# Patient Record
Sex: Female | Born: 1995 | Race: White | Hispanic: No | Marital: Single | Smoking: Former smoker
Health system: Southern US, Community
[De-identification: ages and names within clinical notes are randomized; demographics above are authoritative.]

## PROBLEM LIST (undated history)

## (undated) DIAGNOSIS — F32A Depression, unspecified: Secondary | ICD-10-CM

## (undated) DIAGNOSIS — Z8249 Family history of ischemic heart disease and other diseases of the circulatory system: Secondary | ICD-10-CM

## (undated) DIAGNOSIS — N83209 Unspecified ovarian cyst, unspecified side: Secondary | ICD-10-CM

## (undated) DIAGNOSIS — N809 Endometriosis, unspecified: Secondary | ICD-10-CM

## (undated) DIAGNOSIS — F419 Anxiety disorder, unspecified: Secondary | ICD-10-CM

## (undated) DIAGNOSIS — F329 Major depressive disorder, single episode, unspecified: Secondary | ICD-10-CM

## (undated) DIAGNOSIS — M545 Low back pain, unspecified: Secondary | ICD-10-CM

## (undated) HISTORY — PX: OTHER SURGICAL HISTORY: SHX169

## (undated) HISTORY — DX: Endometriosis, unspecified: N80.9

## (undated) HISTORY — DX: Family history of ischemic heart disease and other diseases of the circulatory system: Z82.49

---
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
## 2015-04-18 ENCOUNTER — Encounter (HOSPITAL_COMMUNITY): Payer: Self-pay | Admitting: Emergency Medicine

## 2015-04-18 ENCOUNTER — Emergency Department (HOSPITAL_COMMUNITY)
Admission: EM | Admit: 2015-04-18 | Discharge: 2015-04-18 | Discharge status: Absent without leave | Payer: Self-pay | Source: Home / Self Care

## 2015-04-18 ENCOUNTER — Emergency Department (HOSPITAL_COMMUNITY): Payer: Medicaid Other

## 2015-04-18 ENCOUNTER — Emergency Department (HOSPITAL_COMMUNITY)
Admission: EM | Admit: 2015-04-18 | Discharge: 2015-04-18 | Disposition: A | Discharge status: Home / Self Care | Payer: Medicaid Other | Attending: Emergency Medicine | Admitting: Emergency Medicine

## 2015-04-18 DIAGNOSIS — M79662 Pain in left lower leg: Secondary | ICD-10-CM | POA: Insufficient documentation

## 2015-04-18 DIAGNOSIS — J45901 Unspecified asthma with (acute) exacerbation: Secondary | ICD-10-CM | POA: Insufficient documentation

## 2015-04-18 DIAGNOSIS — Z8659 Personal history of other mental and behavioral disorders: Secondary | ICD-10-CM | POA: Insufficient documentation

## 2015-04-18 DIAGNOSIS — J029 Acute pharyngitis, unspecified: Secondary | ICD-10-CM | POA: Insufficient documentation

## 2015-04-18 DIAGNOSIS — Z720 Tobacco use: Secondary | ICD-10-CM | POA: Insufficient documentation

## 2015-04-18 DIAGNOSIS — Z79899 Other long term (current) drug therapy: Secondary | ICD-10-CM | POA: Insufficient documentation

## 2015-04-18 DIAGNOSIS — M79605 Pain in left leg: Secondary | ICD-10-CM

## 2015-04-18 DIAGNOSIS — Z8742 Personal history of other diseases of the female genital tract: Secondary | ICD-10-CM | POA: Insufficient documentation

## 2015-04-18 LAB — D-DIMER, QUANTITATIVE (NOT AT ARMC)

## 2015-04-18 MED ORDER — ALBUTEROL SULFATE HFA 108 (90 BASE) MCG/ACT IN AERS
2.0000 | INHALATION_SPRAY | Freq: Once | RESPIRATORY_TRACT | Status: AC
  Administered 2015-04-18: 2 via RESPIRATORY_TRACT
  Filled 2015-04-18: qty 6.7

## 2015-04-18 NOTE — ED Notes (Signed)
Per pt, states cold symptoms, sore throat and congestion for over a week

## 2015-04-18 NOTE — Discharge Instructions (Signed)
There is not appear to be an emergent cause for your symptoms at this time. There is no evidence of blood clot or skin infection. Please take over-the-counter medications for your respiratory complaints. Use your inhaler as needed. Follow-up with your doctors.

## 2015-04-18 NOTE — ED Provider Notes (Signed)
CSN: [id number]     Arrival date & time 04/18/15  1355 History  This chart was scribed for non-physician practitioner Joycie Peek, PA-C working with Mirian Mo, MD by Murriel Hopper, ED Scribe. This patient was seen in room WTR6/WTR6 and the patient's care was started at 3:30 PM.    Chief Complaint  Patient presents with  . URI      The history is provided by the patient. No language interpreter was used.     HPI Comments: Christina Contreras is a 19 y.o. female with Asthma who presents to the Emergency Department complaining of constant worsening posterior left lower leg pain that has been present for over a week. Pt reports that her lower leg feels tight, and states when she walks she feels a stretch like it is going to pop. Her father states that she has almost collapsed while walking on several occasions. Her father also states that she is in need of an inhaler as pt reports she has had SOB, cough, sore throat for the past 3 days. Pt denies any recent travel, surgeries, hemoptysis, chest pain and states that she takes birth control.     Past Medical History  Diagnosis Date  . Asthma   . Other and unspecified ovarian cysts   . Anxiety     no med  . Depression     no med  . Lower back pain     ? DDD  . Endometriosis determined by laparoscopy    Past Surgical History  Procedure Laterality Date  . Tubes in ears    . Laparoscopy N/A 08/16/2014    Procedure: LAPAROSCOPY DIAGNOSTIC;  Surgeon: Allie Bossier, MD;  Location: [facility];  Service: Gynecology;  Laterality: N/A;   Family History  Problem Relation Age of Onset  . Endometriosis Mother   . Endometriosis Other    Social History  Substance Use Topics  . Smoking status: Current Every Day Smoker -- 0.25 packs/day for 3 years    Types: Cigarettes  . Smokeless tobacco: Never Used  . Alcohol Use: No   OB History    Gravida Para Term Preterm AB TAB SAB Ectopic Multiple Living   0 0 0 0 0 0 0 0 0 0      Review of Systems   Constitutional: Negative for fever and chills.  HENT: Positive for sore throat.   Respiratory: Positive for cough and shortness of breath.   Musculoskeletal: Positive for myalgias and arthralgias.      Allergies  Review of patient's allergies indicates no known allergies.  Home Medications   Prior to Admission medications   Medication Sig Start Date End Date Taking? Authorizing Provider  BIOTIN PO Take 1 tablet by mouth daily.   Yes Historical Provider, MD  ibuprofen (ADVIL,MOTRIN) 200 MG tablet Take 400 mg by mouth every 6 (six) hours as needed for moderate pain.    Yes Historical Provider, MD  IRON PO Take 1 tablet by mouth daily with breakfast.   Yes Historical Provider, MD  Norethindrone Acetate-Ethinyl Estrad-FE (LOESTRIN 24 FE) 1-20 MG-MCG(24) tablet Take 1 tablet by mouth daily. 12/28/14  Yes Willodean Rosenthal, MD  Levonorgestrel-Ethinyl Estradiol (AMETHIA,CAMRESE) 0.15-0.03 &0.01 MG tablet Take 1 tablet by mouth daily. Patient not taking: Reported on 12/28/2014 09/19/14   Allie Bossier, MD   BP 111/52 mmHg  Pulse 70  Temp(Src) 98.2 F (36.8 C) (Oral)  Resp 16  SpO2 99%  LMP 04/18/2015 Physical Exam  Constitutional: She is oriented to person,  place, and time. She appears well-developed and well-nourished.  Awake, alert, nontoxic appearance.  HENT:  Head: Normocephalic and atraumatic.  Eyes: Right eye exhibits no discharge. Left eye exhibits no discharge.  Neck: Neck supple.  Cardiovascular: Normal rate.   Pulmonary/Chest: Effort normal. She exhibits no tenderness.  Abdominal: Soft. She exhibits no distension. There is no tenderness. There is no rebound.  Musculoskeletal: She exhibits no tenderness.  Baseline ROM, no obvious new focal weakness. Diffuse tenderness throughout left calf and lower extremity. No obvious swelling or erythema. Dorsalis pedis pulses intact and equal bilaterally.  Neurological: She is alert and oriented to person, place, and time.  Mental  status and motor strength appears baseline for patient and situation.  Skin: Skin is warm and dry. No rash noted.  Psychiatric: She has a normal mood and affect.  Nursing note and vitals reviewed.   ED Course  Procedures (including critical care time)  DIAGNOSTIC STUDIES: Oxygen Saturation is 100% on room air, normal by my interpretation.    COORDINATION OF CARE: 3:36 PM Discussed treatment plan with pt at bedside and pt agreed to plan.   Labs Review Labs Reviewed  D-DIMER, QUANTITATIVE (NOT AT [facility])    Imaging Review Dg Tibia/fibula Left  04/18/2015   CLINICAL DATA:  19 year old female with persistent unexplained left lower extremity pain for 3 days. Initial encounter.  EXAM: LEFT TIBIA AND FIBULA - 2 VIEW  COMPARISON:  Left knee series 5613.  Left ankle series 815 2009.  FINDINGS: Bone mineralization is within normal limits. Left tibia and fibula intact. Alignment at the left knee and ankle appears normal. Calcaneus intact. Grossly normal soft tissue contours.  IMPRESSION: No acute osseous abnormality identified in the left tib-fib.   Electronically Signed   By: Odessa Fleming M.D.   On: 04/18/2015 16:13   I have personally reviewed and evaluated these images and lab results as part of my medical decision-making.   EKG Interpretation None     Meds given in ED:  Medications  albuterol (PROVENTIL HFA;VENTOLIN HFA) 108 (90 BASE) MCG/ACT inhaler 2 puff (2 puffs Inhalation Given 04/18/15 1746)    Discharge Medication List as of 04/18/2015  5:42 PM     Filed Vitals:   04/18/15 1408 04/18/15 1704  BP: 101/76 111/52  Pulse: 107 70  Temp: 98.2 F (36.8 C)   TempSrc: Oral   Resp: 16   SpO2: 100% 99%    MDM  Vitals stable  -afebrile Pt resting comfortably in ED. PE--normal lung exam, no tachycardia on my exam, heart rate mid 80s Labwork--Due to patient's tachycardia on arrival, complaints of leg pain and tightness along with OCP risk factors, we will obtain d-dimer. D-dimer is  negative. Imaging--Plain films of left tibia show no osseous abnormalities or gaseous pattern suggestive of any necrotizing process.  No evidence of other acute or emergent pathology at this time. Patient DC'd with albuterol inhaler and instructions for supportive care at home. I discussed all relevant lab findings and imaging results with pt and they verbalized understanding. Discussed f/u with PCP within 48 hrs and return precautions, pt very amenable to plan.  Final diagnoses:  Left leg pain    I personally performed the services described in this documentation, which was scribed in my presence. The recorded information has been reviewed and is accurate.    Joycie Peek, PA-C 04/18/15 2038  Mirian Mo, MD 04/20/15 [phone]

## 2015-05-14 ENCOUNTER — Encounter: Payer: Self-pay | Admitting: Obstetrics & Gynecology

## 2015-05-14 ENCOUNTER — Ambulatory Visit (INDEPENDENT_AMBULATORY_CARE_PROVIDER_SITE_OTHER): Payer: Self-pay | Admitting: Obstetrics & Gynecology

## 2015-05-14 VITALS — BP 112/70 | HR 92 | Resp 18 | Ht 67.0 in | Wt 163.0 lb

## 2015-05-14 DIAGNOSIS — N809 Endometriosis, unspecified: Secondary | ICD-10-CM

## 2015-05-14 DIAGNOSIS — Z3041 Encounter for surveillance of contraceptive pills: Secondary | ICD-10-CM

## 2015-05-14 DIAGNOSIS — N939 Abnormal uterine and vaginal bleeding, unspecified: Secondary | ICD-10-CM

## 2015-05-14 MED ORDER — NORETHIN ACE-ETH ESTRAD-FE 1-20 MG-MCG(24) PO TABS: 1.0000 | ORAL_TABLET | Freq: Every day | ORAL | Status: DC

## 2015-05-14 MED ORDER — TRAMADOL HCL 50 MG PO TABS: 50.0000 mg | ORAL_TABLET | Freq: Four times a day (QID) | ORAL | Status: DC | PRN

## 2015-05-14 NOTE — Progress Notes (Signed)
CLINIC ENCOUNTER NOTE  History:  19 y.o. G0P0000 here today for evaluation of pain that started 3-4 weeks ago.  She has biops-proven endometriosis, and has been on OCPs which helps with her pain.  This pain reminds her of when she was first diagnosed with endometriosis, feels she has a cyst.  Pain is in lower abdomen, diffuse but L>R, constant, not alleviated by Ibuprofen 600 mg every six hours.  The pain made it heard for her to go to work today. No nausea, vomiting, abnormal vaginal discharge, bleeding or other concerns.   Past Medical History  Diagnosis Date  . Asthma   . Other and unspecified ovarian cysts   . Anxiety     no med  . Depression     no med  . Lower back pain     ? DDD  . Endometriosis determined by laparoscopy 08/16/14    Past Surgical History  Procedure Laterality Date  . Tubes in ears    . Laparoscopy N/A 08/16/2014    Procedure: LAPAROSCOPY DIAGNOSTIC;  Surgeon: Allie Bossier, MD;  Location: [facility];  Service: Gynecology;  Laterality: N/A;    The following portions of the patient's history were reviewed and updated as appropriate: allergies, current medications, past family history, past medical history, past social history, past surgical history and problem list.   Health Maintenance:  Never had HPV vaccine series  Review of Systems:  Pertinent items are noted in HPI. Comprehensive review of systems was otherwise negative.  Objective:  Physical Exam BP 112/70 mmHg  Pulse 92  Resp 18  Ht  (1.702 m)  Wt 163 lb (73.936 kg)  BMI 25.52 kg/m2  LMP 04/18/2015 (Approximate) CONSTITUTIONAL: Well-developed, well-nourished female in no acute distress.  HENT:  Normocephalic, atraumatic. External right and left ear normal. Oropharynx is clear and moist EYES: Conjunctivae and EOM are normal. Pupils are equal, round, and reactive to light. No scleral icterus.  NECK: Normal range of motion, supple, no masses SKIN: Skin is warm and dry. No rash noted. Not  diaphoretic. No erythema. No pallor. NEUROLGIC: Alert and oriented to person, place, and time. Normal reflexes, muscle tone coordination. No cranial nerve deficit noted. PSYCHIATRIC: Normal mood and affect. Normal behavior. Normal judgment and thought content. CARDIOVASCULAR: Normal heart rate noted RESPIRATORY: Effort and breath sounds normal, no problems with respiration noted ABDOMEN: Soft, no distention noted, +TTP in lower abdomen, no rebound or guarding.   PELVIC: Normal appearing external genitalia. Moderate uterine and bilateral adnexal tenderness (L>R), no large cysts palpated. MUSCULOSKELETAL: Normal range of motion. No edema noted.   Assessment & Plan:  Endometriosis with acute pain Counseled about taking pills in a continuous fashion to minimize changes of forming ovarian cysts.  Also prescribed Tramadol for severe pain, to be used in combination with Ibuprofen. Ultrasound ordered; will follow up results and manage accordingly. - Norethindrone Acetate-Ethinyl Estrad-FE (LOESTRIN 24 FE) 1-20 MG-MCG(24) tablet; Take 1 tablet by mouth daily. Take only active pills in a continuous fashion. Do not take placebo pills.  Dispense: 3 Package; Refill: 5 - traMADol (ULTRAM) 50 MG tablet; Take 1 tablet (50 mg total) by mouth every 6 (six) hours as needed for severe pain.  Dispense: 60 tablet; Refill: 0 - US Pelvis Complete; Future - US Transvaginal Non-OB; Future Routine preventative health maintenance measures emphasized, counseled about HPV vaccine.  Information given to her, she will decide and let us know if she agrees to get this. Please refer to After Visit Summary  for other counseling recommendations.    Total face-to-face time with patient: 15 minutes. Over 50% of encounter was spent on counseling and coordination of care.   Jaynie Collins, MD, FACOG Attending Obstetrician & Gynecologist, [facility]

## 2015-05-14 NOTE — Patient Instructions (Addendum)
Return to clinic for any scheduled appointments or for any gynecologic concerns as needed.   HPV Vaccine Gardasil (Human Papillomavirus): What You Need to Know 1. What is HPV? Genital human papillomavirus (HPV) is the most common sexually transmitted virus in the [place]. More than half of sexually active men and women are infected with HPV at some time in their lives. About 20 million Americans are currently infected, and about 6 million more get infected each year. HPV is usually spread through sexual contact. Most HPV infections don't cause any symptoms, and go away on their own. But HPV can cause cervical cancer in women. Cervical cancer is the 2nd leading cause of cancer deaths among women around the world. In the [place], about 12,000 women get cervical cancer every year and about 4,000 are expected to die from it. HPV is also associated with several less common cancers, such as vaginal and vulvar cancers in women, and anal and oropharyngeal (back of the throat, including base of tongue and tonsils) cancers in both men and women. HPV can also cause genital warts and warts in the throat. There is no cure for HPV infection, but some of the problems it causes can be treated. 2. HPV vaccine: Why get vaccinated? The HPV vaccine you are getting is one of two vaccines that can be given to prevent HPV. It may be given to both males and females.  This vaccine can prevent most cases of cervical cancer in females, if it is given before exposure to the virus. In addition, it can prevent vaginal and vulvar cancer in females, and genital warts and anal cancer in both males and females. Protection from HPV vaccine is expected to be long-lasting. But vaccination is not a substitute for cervical cancer screening. Women should still get regular Pap tests. 3. Who should get this HPV vaccine and when? HPV vaccine is given as a 3-dose series  1st Dose: Now  2nd Dose: 1 to 2 months after Dose 1  3rd  Dose: 6 months after Dose 1 Additional (booster) doses are not recommended. Routine vaccination  This HPV vaccine is recommended for girls and boys 11 or 19 years of age. It may be given starting at age 9. Why is HPV vaccine recommended at 11 or 19 years of age?  HPV infection is easily acquired, even with only one sex partner. That is why it is important to get HPV vaccine before any sexual contact takes place. Also, response to the vaccine is better at this age than at older ages. Catch-up vaccination This vaccine is recommended for the following people who have not completed the 3-dose series:   Females 13 through 19 years of age.  Males 13 through 19 years of age. This vaccine may be given to men 22 through 19 years of age who have not completed the 3-dose series. It is recommended for men through age 26 who have sex with men or whose immune system is weakened because of HIV infection, other illness, or medications.  HPV vaccine may be given at the same time as other vaccines. 4. Some people should not get HPV vaccine or should wait.  Anyone who has ever had a life-threatening allergic reaction to any component of HPV vaccine, or to a previous dose of HPV vaccine, should not get the vaccine. Tell your doctor if the person getting vaccinated has any severe allergies, including an allergy to yeast.  HPV vaccine is not recommended for pregnant women. However, receiving HPV   when pregnant is not a reason to consider terminating the pregnancy. Women who are breast feeding may get the vaccine.  People who are mildly ill when a dose of HPV is planned can still be vaccinated. People with a moderate or severe illness should wait until they are better. 5. What are the risks from this vaccine? This HPV vaccine has been used in the [place] and around the world for about six years and has been very safe. However, any medicine could possibly cause a serious problem, such as a severe allergic  reaction. The risk of any vaccine causing a serious injury, or death, is extremely small. Life-threatening allergic reactions from vaccines are very rare. If they do occur, it would be within a few minutes to a few hours after the vaccination. Several mild to moderate problems are known to occur with this HPV vaccine. These do not last long and go away on their own.  Reactions in the arm where the shot was given:  Pain (about 8 people in 10)  Redness or swelling (about 1 person in 4)  Fever:  Mild (100 F) (about 1 person in 10)  Moderate (102 F) (about 1 person in 79)  Other problems:  Headache (about 1 person in 3)  Fainting: Brief fainting spells and related symptoms (such as jerking movements) can happen after any medical procedure, including vaccination. Sitting or lying down for about 15 minutes after a vaccination can help prevent fainting and injuries caused by falls. Tell your doctor if the patient feels dizzy or light-headed, or has vision changes or ringing in the ears.  Like all vaccines, HPV vaccines will continue to be monitored for unusual or severe problems. 6. What if there is a serious reaction? What should I look for?  Look for anything that concerns you, such as signs of a severe allergic reaction, very high fever, or behavior changes. Signs of a severe allergic reaction can include hives, swelling of the face and throat, difficulty breathing, a fast heartbeat, dizziness, and weakness. These would start a few minutes to a few hours after the vaccination.  What should I do?  If you think it is a severe allergic reaction or other emergency that can't wait, call 9-1-1 or get the person to the nearest hospital. Otherwise, call your doctor.  Afterward, the reaction should be reported to the Vaccine Adverse Event Reporting System (VAERS). Your doctor might file this report, or you can do it yourself through the VAERS web site at www.vaers.LAgents.no, or by calling  1-(470)787-3161. VAERS is only for reporting reactions. They do not give medical advice. 7. The National Vaccine Injury Compensation Program  The [employer] Vaccine Injury Compensation Program (VICP) is a federal program that was created to compensate people who may have been injured by certain vaccines.  Persons who believe they may have been injured by a vaccine can learn about the program and about filing a claim by calling 1-226-734-2792 or visiting the VICP website at SpiritualWord.at. 8. How can I learn more?  Ask your doctor.  Call your local or state health department.  Contact the Centers for Disease Control and Prevention (CDC):  Call (367)857-4107 (1-800-CDC-INFO)  or  Visit CDC's website at PicCapture.uy CDC Human Papillomavirus (HPV) Gardasil (Interim) 01/02/12 Document Released: 06/01/2006 Document Revised: 12/19/2013 Document Reviewed: 09/15/2013 ExitCare Patient Information 2015 [place], [place]. This information is not intended to replace advice given to you by your health care Newel Oien. Make sure you discuss any questions you have with your  health care Joss Friedel.

## 2015-05-18 ENCOUNTER — Ambulatory Visit (HOSPITAL_COMMUNITY)
Admission: RE | Admit: 2015-05-18 | Discharge: 2015-05-18 | Disposition: A | Discharge status: Home / Self Care | Payer: Medicaid Other | Source: Ambulatory Visit | Attending: Obstetrics & Gynecology | Admitting: Obstetrics & Gynecology

## 2015-05-18 DIAGNOSIS — R102 Pelvic and perineal pain: Secondary | ICD-10-CM | POA: Insufficient documentation

## 2015-05-18 DIAGNOSIS — N809 Endometriosis, unspecified: Secondary | ICD-10-CM | POA: Insufficient documentation

## 2015-05-23 ENCOUNTER — Encounter: Payer: Self-pay | Admitting: Obstetrics & Gynecology

## 2015-05-23 ENCOUNTER — Ambulatory Visit (INDEPENDENT_AMBULATORY_CARE_PROVIDER_SITE_OTHER): Payer: Medicaid Other | Admitting: Obstetrics & Gynecology

## 2015-05-23 VITALS — BP 122/70 | HR 98 | Resp 18 | Ht 67.0 in | Wt 166.0 lb

## 2015-05-23 DIAGNOSIS — Z30013 Encounter for initial prescription of injectable contraceptive: Secondary | ICD-10-CM

## 2015-05-23 DIAGNOSIS — Z23 Encounter for immunization: Secondary | ICD-10-CM

## 2015-05-23 DIAGNOSIS — N809 Endometriosis, unspecified: Secondary | ICD-10-CM

## 2015-05-23 MED ORDER — MEDROXYPROGESTERONE ACETATE 150 MG/ML IM SUSP
150.0000 mg | INTRAMUSCULAR | Status: AC
  Administered 2015-05-23: 150 mg via INTRAMUSCULAR

## 2015-05-23 NOTE — Progress Notes (Signed)
CLINIC ENCOUNTER NOTE  History:  19 y.o. G0P0000 here today for follow up after recent ultrasound; has history of biopsy-proven endometriosis. She still reports diffuse lower abdominal pain, helped a little by taking active OCPs pills continuously and Tramadol.  Wants to discuss other management options.  Denies any abnormal vaginal discharge, bleeding, or other concerns.   Interested in starting HPV vaccine series and obtaining flu vaccine today.  Past Medical History  Diagnosis Date  . Asthma   . Other and unspecified ovarian cysts   . Anxiety     no med  . Depression     no med  . Lower back pain     ? DDD  . Endometriosis determined by laparoscopy 08/16/14    Past Surgical History  Procedure Laterality Date  . Tubes in ears    . Laparoscopy N/A 08/16/2014    Procedure: LAPAROSCOPY DIAGNOSTIC;  Surgeon: Allie Bossier, MD;  Location: [facility];  Service: Gynecology;  Laterality: N/A;    The following portions of the patient's history were reviewed and updated as appropriate: allergies, current medications, past family history, past medical history, past social history, past surgical history and problem list.   Health Maintenance:  Never had HPV vaccine series   Review of Systems:  Pertinent items noted in HPI and remainder of comprehensive ROS otherwise negative.  Objective:  Physical Exam BP 122/70 mmHg  Pulse 98  Resp 18  Ht  (1.702 m)  Wt 166 lb (75.297 kg)  BMI 25.99 kg/m2  LMP 05/16/2015 CONSTITUTIONAL: Well-developed, well-nourished female in no acute distress.  HENT:  Normocephalic, atraumatic. External right and left ear normal. Oropharynx is clear and moist EYES: Conjunctivae and EOM are normal. Pupils are equal, round, and reactive to light. No scleral icterus.  NECK: Normal range of motion, supple, no masses SKIN: Skin is warm and dry. No rash noted. Not diaphoretic. No erythema. No pallor. NEUROLGIC: Alert and oriented to person, place, and time. Normal  reflexes, muscle tone coordination. No cranial nerve deficit noted. PSYCHIATRIC: Normal mood and affect. Normal behavior. Normal judgment and thought content. CARDIOVASCULAR: Normal heart rate noted RESPIRATORY: Effort and breath sounds normal, no problems with respiration noted ABDOMEN: Soft, no distention noted, mild-moderate diffuse lower abdominal tenderness, no rebound or guarding.  PELVIC: Deferred MUSCULOSKELETAL: Normal range of motion. No edema noted.  Labs and Imaging US Transvaginal Non-ob  05/18/2015   CLINICAL DATA:  Endometriosis.  Pelvic pain for 4 weeks.  EXAM: TRANSABDOMINAL AND TRANSVAGINAL ULTRASOUND OF PELVIS  TECHNIQUE: Both transabdominal and transvaginal ultrasound examinations of the pelvis were performed. Transabdominal technique was performed for global imaging of the pelvis including uterus, ovaries, adnexal regions, and pelvic cul-de-sac. It was necessary to proceed with endovaginal exam following the transabdominal exam to visualize the uterus and ovaries.  COMPARISON:  None  FINDINGS: Uterus  Measurements: 7.0 x 2.1 x 4.0 cm. No fibroids or other mass visualized.  Endometrium  Thickness: Normal thickness, 1 mm.  No focal abnormality visualized.  Right ovary  Measurements: 3.3 x 1.6 x 4.1 cm. Normal appearance/no adnexal mass.  Left ovary  Measurements: 3.5 x 3.2 x 1.9 cm. Normal appearance/no adnexal mass.  Other findings  No free fluid.  IMPRESSION: Normal study.   Electronically Signed   By: Charlett Nose M.D.   On: 05/18/2015 09:06   US Pelvis Complete  05/18/2015   CLINICAL DATA:  Endometriosis.  Pelvic pain for 4 weeks.  EXAM: TRANSABDOMINAL AND TRANSVAGINAL ULTRASOUND OF PELVIS  TECHNIQUE: Both transabdominal and transvaginal ultrasound examinations of the pelvis were performed. Transabdominal technique was performed for global imaging of the pelvis including uterus, ovaries, adnexal regions, and pelvic cul-de-sac. It was necessary to proceed with endovaginal exam  following the transabdominal exam to visualize the uterus and ovaries.  COMPARISON:  None  FINDINGS: Uterus  Measurements: 7.0 x 2.1 x 4.0 cm. No fibroids or other mass visualized.  Endometrium  Thickness: Normal thickness, 1 mm.  No focal abnormality visualized.  Right ovary  Measurements: 3.3 x 1.6 x 4.1 cm. Normal appearance/no adnexal mass.  Left ovary  Measurements: 3.5 x 3.2 x 1.9 cm. Normal appearance/no adnexal mass.  Other findings  No free fluid.  IMPRESSION: Normal study.   Electronically SiCharlett Nose: Kevin  Dover M.D.   On: 05/18/2015 09:06    Assessment & Plan:  1. Endometriosis Patient reassured by ultrasound results.  Currently on Loestrin 24 Fe, active pills only. Does not want to try 30- 35 mcg estradiol OCP as this made her nauseated in past.  Discussed Depo Provera vs Depo Lupron vs aromatase inhibitors (off label use) vs surgery.  Discussed risks and benefits of all modalities in detail, all questions answered. Wants to try Depo Provera for now. - MedroxyPROGESTERone (DEPO-PROVERA) injection 150 mg; Inject 1 mL (150 mg total) into the muscle every 3 (three) months. Continue analgesia as needed. Talked about connection of endometriosis to IBS, IC as other etiologies of abdominal-pelvic pain; these will need to be considered if pain worsens or not able to be controlled on hormonal therapy.  2. HPV Vaccine Series initiated today. Will return in 1-2 months and 6 months for rest of series  3. Flu shot obtained today Routine preventative health maintenance measures emphasized.  Please refer to After Visit Summary for other counseling recommendations.   Total face-to-face time with patient: 20 minutes. Over 50% of encounter was spent on counseling and coordination of care.  Jaynie Collins, MD, FACOG Attending Obstetrician & Gynecologist, [facility]

## 2015-05-23 NOTE — Progress Notes (Signed)
Follow-up, pt still c/o abdominal pain that is increasing and radiating higher in the abdomen.

## 2015-05-23 NOTE — Patient Instructions (Signed)
Return to clinic for any scheduled appointments or for any gynecologic concerns as needed.   

## 2015-06-20 ENCOUNTER — Inpatient Hospital Stay (HOSPITAL_COMMUNITY)
Admission: AD | Admit: 2015-06-20 | Discharge: 2015-06-20 | Disposition: A | Discharge status: Home / Self Care | Payer: Self-pay | Source: Ambulatory Visit | Attending: Obstetrics and Gynecology | Admitting: Obstetrics and Gynecology

## 2015-06-20 ENCOUNTER — Encounter (HOSPITAL_COMMUNITY): Payer: Self-pay | Admitting: *Deleted

## 2015-06-20 DIAGNOSIS — F17210 Nicotine dependence, cigarettes, uncomplicated: Secondary | ICD-10-CM | POA: Insufficient documentation

## 2015-06-20 DIAGNOSIS — Z8742 Personal history of other diseases of the female genital tract: Secondary | ICD-10-CM

## 2015-06-20 DIAGNOSIS — R1030 Lower abdominal pain, unspecified: Secondary | ICD-10-CM | POA: Insufficient documentation

## 2015-06-20 DIAGNOSIS — M549 Dorsalgia, unspecified: Secondary | ICD-10-CM | POA: Insufficient documentation

## 2015-06-20 LAB — URINALYSIS, ROUTINE W REFLEX MICROSCOPIC
BILIRUBIN URINE: NEGATIVE
Glucose, UA: NEGATIVE mg/dL
HGB URINE DIPSTICK: NEGATIVE
Ketones, ur: NEGATIVE mg/dL
Leukocytes, UA: NEGATIVE
Nitrite: NEGATIVE
PROTEIN: NEGATIVE mg/dL
Specific Gravity, Urine: 1.02 (ref 1.005–1.030)
UROBILINOGEN UA: 0.2 mg/dL (ref 0.0–1.0)
pH: 8 (ref 5.0–8.0)

## 2015-06-20 LAB — CBC
HEMATOCRIT: 40.5 % (ref 36.0–46.0)
HEMOGLOBIN: 13.6 g/dL (ref 12.0–15.0)
MCH: 29.7 pg (ref 26.0–34.0)
MCHC: 33.6 g/dL (ref 30.0–36.0)
MCV: 88.4 fL (ref 78.0–100.0)
PLATELETS: 194 10*3/uL (ref 150–400)
RBC: 4.58 MIL/uL (ref 3.87–5.11)
RDW: 13.3 % (ref 11.5–15.5)
WBC: 8.1 10*3/uL (ref 4.0–10.5)

## 2015-06-20 LAB — POCT PREGNANCY, URINE: PREG TEST UR: NEGATIVE

## 2015-06-20 LAB — WET PREP, GENITAL
Clue Cells Wet Prep HPF POC: NONE SEEN
Trich, Wet Prep: NONE SEEN
WBC, Wet Prep HPF POC: NONE SEEN
Yeast Wet Prep HPF POC: NONE SEEN

## 2015-06-20 MED ORDER — KETOROLAC TROMETHAMINE 60 MG/2ML IM SOLN
60.0000 mg | Freq: Once | INTRAMUSCULAR | Status: AC
  Administered 2015-06-20: 60 mg via INTRAMUSCULAR
  Filled 2015-06-20: qty 2

## 2015-06-20 MED ORDER — HYDROCODONE-ACETAMINOPHEN 5-325 MG PO TABS
1.0000 | ORAL_TABLET | Freq: Once | ORAL | Status: AC
Start: 2015-06-20 — End: 2015-06-20
  Administered 2015-06-20: 1 via ORAL
  Filled 2015-06-20: qty 1

## 2015-06-20 NOTE — Discharge Instructions (Signed)

## 2015-06-20 NOTE — MAU Provider Note (Signed)
History     CSN: [id number]  Arrival date and time: 06/20/15 1344   First Provider Initiated Contact with Patient 06/20/15 1523      Chief Complaint  Patient presents with  . Abdominal Pain   HPI   Christina Contreras 19 y.o. G0P0000 nonpregnant female presents complaining of abdominal pain x 2 days.   She is having pain on urination.  She denies smell, is having urinary frequency, thinks she is completely emptying, denies hematuria, fever.  Back pain is a constant problem but it is worse than usual.   She notes increased appetite.   She was diagnosed with endometriosis by laparoscopy in 12/15.   She received first depo shot in September.  She has been on OCP 4-5 months.    She uses tramadol for pain which helps on occasion. Her next appointment with GYN is Friday.    OB History    Gravida Para Term Preterm AB TAB SAB Ectopic Multiple Living   0 0 0 0 0 0 0 0 0 0       Past Medical History  Diagnosis Date  . Asthma   . Other and unspecified ovarian cysts   . Anxiety     no med  . Depression     no med  . Lower back pain     ? DDD  . Endometriosis determined by laparoscopy 08/16/14    Past Surgical History  Procedure Laterality Date  . Tubes in ears    . Laparoscopy N/A 08/16/2014    Procedure: LAPAROSCOPY DIAGNOSTIC;  Surgeon: Allie BossierMyra C Dove, MD;  Location: [facility];  Service: Gynecology;  Laterality: N/A;    Family History  Problem Relation Age of Onset  . Endometriosis Mother   . Endometriosis Other     Social History  Substance Use Topics  . Smoking status: Current Every Day Smoker -- 0.25 packs/day for 3 years    Types: Cigarettes  . Smokeless tobacco: Never Used  . Alcohol Use: No    Allergies: No Known Allergies  Facility-administered medications prior to admission  Medication Dose Route Frequency Provider Last Rate Last Dose  . medroxyPROGESTERone (DEPO-PROVERA) injection 150 mg  150 mg Intramuscular Q90 days Tereso NewcomerUgonna A Anyanwu, MD   150 mg at 05/23/15 1041    Prescriptions prior to admission  Medication Sig Dispense Refill Last Dose  . BIOTIN PO Take 1 tablet by mouth daily.   Taking  . ibuprofen (ADVIL,MOTRIN) 200 MG tablet Take 400 mg by mouth every 6 (six) hours as needed for moderate pain.    Taking  . IRON PO Take 1 tablet by mouth daily with breakfast.   Taking  . Norethindrone Acetate-Ethinyl Estrad-FE (LOESTRIN 24 FE) 1-20 MG-MCG(24) tablet Take 1 tablet by mouth daily. Take only active pills in a continuous fashion. Do not take placebo pills. 3 Package 5 Taking  . traMADol (ULTRAM) 50 MG tablet Take 1 tablet (50 mg total) by mouth every 6 (six) hours as needed for severe pain. 60 tablet 0 Taking   Results for orders placed or performed during the hospital encounter of 06/20/15 (from the past 48 hour(s))  Urinalysis, Routine w reflex microscopic ([facility])     Status: None   Collection Time: 06/20/15  1:46 PM  Result Value Ref Range   Color, Urine YELLOW YELLOW   APPearance CLEAR CLEAR   Specific Gravity, Urine 1.020 1.005 - 1.030   pH 8.0 5.0 - 8.0   Glucose, UA NEGATIVE NEGATIVE mg/dL  Hgb urine dipstick NEGATIVE NEGATIVE   Bilirubin Urine NEGATIVE NEGATIVE   Ketones, ur NEGATIVE NEGATIVE mg/dL   Protein, ur NEGATIVE NEGATIVE mg/dL   Urobilinogen, UA 0.2 0.0 - 1.0 mg/dL   Nitrite NEGATIVE NEGATIVE   Leukocytes, UA NEGATIVE NEGATIVE    Comment: MICROSCOPIC NOT DONE ON URINES WITH NEGATIVE PROTEIN, BLOOD, LEUKOCYTES, NITRITE, OR GLUCOSE <1000 mg/dL.  Pregnancy, urine POC     Status: None   Collection Time: 06/20/15  1:58 PM  Result Value Ref Range   Preg Test, Ur NEGATIVE NEGATIVE    Comment:        THE SENSITIVITY OF THIS METHODOLOGY IS >24 mIU/mL   Wet prep, genital     Status: None   Collection Time: 06/20/15  4:40 PM  Result Value Ref Range   Yeast Wet Prep HPF POC NONE SEEN NONE SEEN   Trich, Wet Prep NONE SEEN NONE SEEN   Clue Cells Wet Prep HPF POC NONE SEEN NONE SEEN   WBC, Wet Prep HPF POC NONE SEEN NONE  SEEN  CBC     Status: None   Collection Time: 06/20/15  5:35 PM  Result Value Ref Range   WBC 8.1 4.0 - 10.5 K/uL   RBC 4.58 3.87 - 5.11 MIL/uL   Hemoglobin 13.6 12.0 - 15.0 g/dL   HCT 11.9 14.7 - 82.9 %   MCV 88.4 78.0 - 100.0 fL   MCH 29.7 26.0 - 34.0 pg   MCHC 33.6 30.0 - 36.0 g/dL   RDW 56.2 13.0 - 86.5 %   Platelets 194 150 - 400 K/uL    ROS  Pertinent ROS in HPI.  All other systems are negative.   Physical Exam   Blood pressure 113/72, pulse 78, temperature 99 F (37.2 C), resp. rate 18, height  (1.676 m), weight 167 lb (75.751 kg), last menstrual period 05/16/2015.  Physical Exam  Constitutional: She is oriented to person, place, and time. She appears well-developed and well-nourished. No distress.  HENT:  Head: Normocephalic and atraumatic.  Eyes: Conjunctivae and EOM are normal.  Neck: Normal range of motion.  Cardiovascular: Normal rate, regular rhythm and normal heart sounds.   Respiratory: Effort normal and breath sounds normal. No respiratory distress.  GI: Soft. Bowel sounds are normal. She exhibits no distension. There is tenderness. There is no rebound.  Diffusely tender across lower abdomen  Genitourinary:  Mod amt of yellowish homogenous discharge in vault No CMT, no adnexal mass appreciated.  Diffuse tenderness noted  Musculoskeletal: Normal range of motion. She exhibits no edema.  Neurological: She is alert and oriented to person, place, and time.  Skin: Skin is warm and dry.  Psychiatric: She has a normal mood and affect. Her behavior is normal.   Report given and care turned over to Venia Carbon, NP.  5:00pm K. Glyn Ade, PA-C  MAU Course  Procedures  None  MDM  Vicodin 1 tab given Toradol 60 mg IM  CBC; normal   Patient feels her pain has lessoned "some." Patient has a current prescription for tramadol and naproxen.    Assessment and Plan   A:  1. Lower abdominal pain   2. Hx of endometriosis    P:  Discharge home in  stable condition Continue pain medication regimen Return to MAU if symptoms worsen Follow up with GYN as scheduled on Friday    Bertram Denver 06/20/2015, 3:43 PM   Duane Lope, NP 06/20/2015 7:18 PM

## 2015-06-20 NOTE — MAU Note (Signed)
Pt presents to MAU with complaints of lower abdominal pain for months but got worse yesterday. Denies any vaginal bleeding or abnormal discharge

## 2015-06-22 ENCOUNTER — Ambulatory Visit (INDEPENDENT_AMBULATORY_CARE_PROVIDER_SITE_OTHER): Payer: Self-pay | Admitting: *Deleted

## 2015-06-22 DIAGNOSIS — Z23 Encounter for immunization: Secondary | ICD-10-CM

## 2015-06-22 LAB — GC/CHLAMYDIA PROBE AMP (~~LOC~~) NOT AT ARMC
Chlamydia: NEGATIVE
NEISSERIA GONORRHEA: NEGATIVE

## 2015-06-22 NOTE — Progress Notes (Signed)
Patient here today for HPV 2.

## 2015-08-09 ENCOUNTER — Ambulatory Visit (INDEPENDENT_AMBULATORY_CARE_PROVIDER_SITE_OTHER): Payer: Medicaid Other | Admitting: *Deleted

## 2015-08-09 DIAGNOSIS — Z3042 Encounter for surveillance of injectable contraceptive: Secondary | ICD-10-CM

## 2015-08-09 MED ORDER — MEDROXYPROGESTERONE ACETATE 150 MG/ML IM SUSP
150.0000 mg | Freq: Once | INTRAMUSCULAR | Status: AC
  Administered 2015-08-09: 150 mg via INTRAMUSCULAR

## 2015-08-09 NOTE — Progress Notes (Signed)
Patient here today for Depo Provera injection.  

## 2015-08-21 ENCOUNTER — Ambulatory Visit (INDEPENDENT_AMBULATORY_CARE_PROVIDER_SITE_OTHER): Payer: Self-pay | Admitting: Obstetrics & Gynecology

## 2015-08-21 ENCOUNTER — Encounter: Payer: Self-pay | Admitting: Obstetrics & Gynecology

## 2015-08-21 VITALS — BP 119/74 | HR 96 | Wt 175.0 lb

## 2015-08-21 DIAGNOSIS — N921 Excessive and frequent menstruation with irregular cycle: Secondary | ICD-10-CM

## 2015-08-21 LAB — CBC
HCT: 41.8 % (ref 36.0–46.0)
Hemoglobin: 13.6 g/dL (ref 12.0–15.0)
MCH: 28.9 pg (ref 26.0–34.0)
MCHC: 32.5 g/dL (ref 30.0–36.0)
MCV: 88.9 fL (ref 78.0–100.0)
MPV: 9.8 fL (ref 8.6–12.4)
PLATELETS: 284 10*3/uL (ref 150–400)
RBC: 4.7 MIL/uL (ref 3.87–5.11)
RDW: 12.9 % (ref 11.5–15.5)
WBC: 9.4 10*3/uL (ref 4.0–10.5)

## 2015-08-21 MED ORDER — MISOPROSTOL 200 MCG PO TABS: ORAL_TABLET | ORAL | Status: DC

## 2015-08-21 NOTE — Progress Notes (Signed)
   Subjective:    Patient ID: Christina Contreras, female    DOB: 09-15-1995, 20 y.o.   MRN: [id number]  HPI 20 yo SW G0 is here with heavy bleeding. She is on her second depo provera shot and is taking the lo estrin to try to slow down her bleeding. She has had to change her large tampon 3 times this morning.   Review of Systems She has biopsy-proven endometriosis. Depo provera nor the OCPs have helped her pelvic pain. She is still having to miss work, which she doesn't like to do. She has been monogamous for about 5 years.    Objective:   Physical Exam [id number] Breathing, conversing, and ambulating normally With a speculum exam, I don't see much blood but she did just remove her tampon.       Assessment & Plan:  Continued pain with endometriosis- She will try the Skyla this Friday. I will pretreat her with cytotec. She has read about Lupron and is not excited about trying it at this point.

## 2015-08-22 LAB — TSH: TSH: 1.687 u[IU]/mL (ref 0.350–4.500)

## 2015-08-24 ENCOUNTER — Ambulatory Visit: Payer: Self-pay | Admitting: Obstetrics & Gynecology

## 2015-08-29 ENCOUNTER — Encounter: Payer: Self-pay | Admitting: Obstetrics & Gynecology

## 2015-08-29 ENCOUNTER — Ambulatory Visit (INDEPENDENT_AMBULATORY_CARE_PROVIDER_SITE_OTHER): Payer: Medicaid Other | Admitting: Obstetrics & Gynecology

## 2015-08-29 ENCOUNTER — Encounter: Payer: Self-pay | Admitting: *Deleted

## 2015-08-29 VITALS — BP 106/69 | HR 99 | Resp 18 | Wt 176.0 lb

## 2015-08-29 DIAGNOSIS — Z30430 Encounter for insertion of intrauterine contraceptive device: Secondary | ICD-10-CM | POA: Diagnosis not present

## 2015-08-29 MED ORDER — LEVONORGESTREL 13.5 MG IU IUD
13.5000 mg | INTRAUTERINE_SYSTEM | Freq: Once | INTRAUTERINE | Status: AC
  Administered 2015-08-29: 13.5 mg via INTRAUTERINE

## 2015-08-29 MED ORDER — DICLOFENAC SODIUM 75 MG PO TBEC: 75.0000 mg | DELAYED_RELEASE_TABLET | Freq: Two times a day (BID) | ORAL | Status: DC | PRN

## 2015-08-29 NOTE — Progress Notes (Signed)
   Subjective:    Patient ID: Christina Contreras, female    DOB: [birth year], 20 y.o.   MRN: [id number]  HPI  20 yo SW G0 here for [place]yla. She took cytotec last night and has had a lot of cramping and bleeding. Her last depo provera was 08/09/15.  Review of Systems     Objective:   Physical Exam  UPT negative, consent signed, Time out procedure done. Cervix prepped with betadine and grasped with a single tooth tenaculum. Christina GriefSkyla was easily placed and the strings were cut to 3-4 cm. Uterus sounded to 8 cm. She tolerated the procedure well.        Assessment & Plan:  Contraception and DUB- Skyla Rec IBU prn

## 2015-08-29 NOTE — Addendum Note (Signed)
Addended by: Gita KudoLASSITER, Levaeh Vice S on: 08/29/2015 10:09 AM   Modules accepted: Orders

## 2015-10-06 ENCOUNTER — Inpatient Hospital Stay (HOSPITAL_COMMUNITY)
Admission: AD | Admit: 2015-10-06 | Discharge: 2015-10-06 | Disposition: A | Discharge status: Home / Self Care | Payer: Self-pay | Source: Ambulatory Visit | Attending: Obstetrics and Gynecology | Admitting: Obstetrics and Gynecology

## 2015-10-06 ENCOUNTER — Encounter (HOSPITAL_COMMUNITY): Payer: Self-pay | Admitting: *Deleted

## 2015-10-06 ENCOUNTER — Inpatient Hospital Stay (HOSPITAL_COMMUNITY): Payer: Self-pay

## 2015-10-06 DIAGNOSIS — Z975 Presence of (intrauterine) contraceptive device: Secondary | ICD-10-CM | POA: Insufficient documentation

## 2015-10-06 DIAGNOSIS — J45909 Unspecified asthma, uncomplicated: Secondary | ICD-10-CM | POA: Insufficient documentation

## 2015-10-06 DIAGNOSIS — N939 Abnormal uterine and vaginal bleeding, unspecified: Secondary | ICD-10-CM | POA: Insufficient documentation

## 2015-10-06 DIAGNOSIS — T839XXA Unspecified complication of genitourinary prosthetic device, implant and graft, initial encounter: Secondary | ICD-10-CM

## 2015-10-06 DIAGNOSIS — R109 Unspecified abdominal pain: Secondary | ICD-10-CM | POA: Insufficient documentation

## 2015-10-06 DIAGNOSIS — F329 Major depressive disorder, single episode, unspecified: Secondary | ICD-10-CM | POA: Insufficient documentation

## 2015-10-06 DIAGNOSIS — F17210 Nicotine dependence, cigarettes, uncomplicated: Secondary | ICD-10-CM | POA: Insufficient documentation

## 2015-10-06 LAB — URINALYSIS, ROUTINE W REFLEX MICROSCOPIC
Bilirubin Urine: NEGATIVE
Glucose, UA: NEGATIVE mg/dL
KETONES UR: NEGATIVE mg/dL
LEUKOCYTES UA: NEGATIVE
NITRITE: NEGATIVE
PH: 7.5 (ref 5.0–8.0)
PROTEIN: NEGATIVE mg/dL
Specific Gravity, Urine: 1.015 (ref 1.005–1.030)

## 2015-10-06 LAB — URINE MICROSCOPIC-ADD ON: Bacteria, UA: NONE SEEN

## 2015-10-06 LAB — POCT PREGNANCY, URINE: Preg Test, Ur: NEGATIVE

## 2015-10-06 MED ORDER — IBUPROFEN 600 MG PO TABS: 600.0000 mg | ORAL_TABLET | Freq: Four times a day (QID) | ORAL | Status: DC | PRN

## 2015-10-06 MED ORDER — KETOROLAC TROMETHAMINE 60 MG/2ML IM SOLN
60.0000 mg | Freq: Once | INTRAMUSCULAR | Status: AC
  Administered 2015-10-06: 60 mg via INTRAMUSCULAR
  Filled 2015-10-06: qty 2

## 2015-10-06 NOTE — Discharge Instructions (Signed)
Your IUD is exactly in the right place.  Your ultrasound looks great. It is normal to have periodic spotting with the IUD for 6 months. Get the ibuprofen prescribed today and take as prescribed with food as needed for pain. Stop your other pain medicines for now so you can see which pain medication gives you the best relief. You have the best form of contraception and the best method of controlling excessive bleeding.   You likely just need some more time for your uterus to adjust to having the IUD.

## 2015-10-06 NOTE — MAU Note (Signed)
Pt reports being light headed at times.

## 2015-10-06 NOTE — MAU Provider Note (Signed)
History     CSN: [id number]  Arrival date and time: 10/06/15 1445   First Provider Initiated Contact with Patient 10/06/15 1615      Chief Complaint  Patient presents with  . Abdominal Pain  . Vaginal Bleeding   HPI Christina Contreras 20 y.o. Comes to MAU today with lower abdominal pain and vaginal bleeding that she has had for several weeks.  She had a Skyla IUD inserted by Dr. Marice Potter on 08-29-15 and has not yet been back for her IUD check.  She has had a long history of irregular and painful menses.  Currently is on Depo and the Skyla IUD.  She was diagnosed with endometriosis in 07-2014.  She has some naproxen but has not taken any today as it does not help.  She is frustrated with the IUD and wonders if the pain is from endometriosis or from the IUD.  Has had many days of bleeding since it was inserted and she is wanting the bleeding to stop.  OB History    Gravida Para Term Preterm AB TAB SAB Ectopic Multiple Living   0 0 0 0 0 0 0 0 0 0       Past Medical History  Diagnosis Date  . Asthma   . Other and unspecified ovarian cysts   . Anxiety     no med  . Depression     no med  . Lower back pain     ? DDD  . Endometriosis determined by laparoscopy 08/16/14    Past Surgical History  Procedure Laterality Date  . Tubes in ears    . Laparoscopy N/A 08/16/2014    Procedure: LAPAROSCOPY DIAGNOSTIC;  Surgeon: Allie Bossier, MD;  Location: [facility];  Service: Gynecology;  Laterality: N/A;    Family History  Problem Relation Age of Onset  . Endometriosis Mother   . Endometriosis Other     Social History  Substance Use Topics  . Smoking status: Current Every Day Smoker -- 0.25 packs/day for 3 years    Types: Cigarettes  . Smokeless tobacco: Never Used  . Alcohol Use: No    Allergies: No Known Allergies  Facility-administered medications prior to admission  Medication Dose Route Frequency Provider Last Rate Last Dose  . medroxyPROGESTERone (DEPO-PROVERA) injection 150 mg   150 mg Intramuscular Q90 days Tereso Newcomer, MD   150 mg at 05/23/15 1041   Prescriptions prior to admission  Medication Sig Dispense Refill Last Dose  . albuterol (PROVENTIL HFA;VENTOLIN HFA) 108 (90 BASE) MCG/ACT inhaler Inhale 1-2 puffs into the lungs every 6 (six) hours as needed for wheezing or shortness of breath.   rescue  . BIOTIN PO Take 1 tablet by mouth daily.   Past Week at Unknown time  . diclofenac (VOLTAREN) 75 MG EC tablet Take 1 tablet (75 mg total) by mouth 2 (two) times daily as needed. 60 tablet 1 Past Week at Unknown time  . IRON PO Take 1 tablet by mouth daily with breakfast.   Past Week at Unknown time  . Multiple Vitamins-Minerals (MULTIVITAMIN WITH MINERALS) tablet Take 1 tablet by mouth daily.   Past Week at Unknown time  . naproxen (NAPROSYN) 500 MG tablet Take 500 mg by mouth 2 (two) times daily as needed for moderate pain (back pain).   two weeks  . misoprostol (CYTOTEC) 200 MCG tablet Take 3 pills by mouth the night before IUD insertion. (Patient not taking: Reported on 10/06/2015) 3 tablet 0 Completed Course at  Unknown time  . traMADol (ULTRAM) 50 MG tablet Take 1 tablet (50 mg total) by mouth every 6 (six) hours as needed for severe pain. (Patient not taking: Reported on 10/06/2015) 60 tablet 0 Not Taking at Unknown time    Review of Systems  Constitutional: Negative for fever.  Gastrointestinal: Positive for abdominal pain. Negative for nausea, vomiting, diarrhea and constipation.  Genitourinary:       No vaginal discharge. Vaginal bleeding. No dysuria.   Physical Exam   Blood pressure 113/57, pulse 90, temperature 98.3 F (36.8 C), resp. rate 18, height  (1.702 m), weight 173 lb 6.4 oz (78.654 kg), SpO2 98 %.  Physical Exam  Nursing note and vitals reviewed. Constitutional: She is oriented to person, place, and time. She appears well-developed and well-nourished.  HENT:  Head: Normocephalic.  Eyes: EOM are normal.  Neck: Neck supple.  GI:  Soft. There is tenderness. There is no rebound and no guarding.  Mild tenderness in her lower abdomen which is worse on the right side   Genitourinary:  Speculum exam: Vagina - Very Small amount of pink discharge, no odor Cervix - No contact bleeding, no active bleeding, possibly IUD string seen down in cervical os - difficult to confirm Bimanual exam: Cervix closed, no CMT Uterus mildly tender to palpation, normal size Adnexa non tender, no masses bilaterally GC/Chlam, wet prep done Chaperone present for exam.  Musculoskeletal: Normal range of motion.  Neurological: She is alert and oriented to person, place, and time.  Skin: Skin is warm and dry.  Psychiatric: She has a normal mood and affect.    MAU Course  Procedures Results for orders placed or performed during the hospital encounter of 10/06/15 (from the past 24 hour(s))  Urinalysis, Routine w reflex microscopic (not at [facility])     Status: Abnormal   Collection Time: 10/06/15  3:10 PM  Result Value Ref Range   Color, Urine YELLOW YELLOW   APPearance CLEAR CLEAR   Specific Gravity, Urine 1.015 1.005 - 1.030   pH 7.5 5.0 - 8.0   Glucose, UA NEGATIVE NEGATIVE mg/dL   Hgb urine dipstick MODERATE (A) NEGATIVE   Bilirubin Urine NEGATIVE NEGATIVE   Ketones, ur NEGATIVE NEGATIVE mg/dL   Protein, ur NEGATIVE NEGATIVE mg/dL   Nitrite NEGATIVE NEGATIVE   Leukocytes, UA NEGATIVE NEGATIVE  Urine microscopic-add on     Status: Abnormal   Collection Time: 10/06/15  3:10 PM  Result Value Ref Range   Squamous Epithelial / LPF 0-5 (A) NONE SEEN   WBC, UA 0-5 0 - 5 WBC/hpf   RBC / HPF 0-5 0 - 5 RBC/hpf   Bacteria, UA NONE SEEN NONE SEEN  Pregnancy, urine POC     Status: None   Collection Time: 10/06/15  3:27 PM  Result Value Ref Range   Preg Test, Ur NEGATIVE NEGATIVE    MDM CLINICAL DATA: Pelvic pain with vaginal bleeding since IUD insertion 08/28/2015. Abdominal pain in female patient. Complication of intrauterine device,  initial encounter.  EXAM: ULTRASOUND PELVIS TRANSVAGINAL  TECHNIQUE: Transvaginal ultrasound examination of the pelvis was performed including evaluation of the uterus, ovaries, adnexal regions, and pelvic cul-de-sac.  COMPARISON: Pelvic ultrasound 05/18/2015  FINDINGS: Uterus  Measurements: 7.0 x 3.9 x 3.0 cm. No fibroids or other mass visualized.  Endometrium  Thickness: 4 mm. Shadowing intrauterine device appropriately positioned in the endometrium. Strings are visualized in the cervix.  Right ovary  Measurements: 4.6 x 3.3 x 2.3 cm. Normal appearance/no adnexal mass.  Multiple follicles.  Left ovary  Measurements: 3.7 x 2.7 x 2.7 cm. Normal appearance/no adnexal mass. Multiple follicles.  Other findings: Trace pelvic and right adnexal free fluid.  IMPRESSION: Intrauterine device appropriately positioned in the endometrium.  Normal appearance of both ovaries.   Assessment and Plan  IUD in place appropriate in uterus Cramping and spotting from IUD in for 4 weeks.  Plan Advised client to keep IUD for at least 6 months. Expect to have periodic spotting until you have had the IUD for 6 months. Follow up with Dr. Marice Potter Will eprescribe Ibuprofen 600 mg and take PO q 6 hours with food for pain.  Christina Contreras 10/06/2015, 5:37 PM

## 2015-10-06 NOTE — MAU Note (Signed)
Had Christina Contreras IUD on 08/29/15 has been having lower abdominal pain and vaginal bleeding changed pad one time today, history of endometriosis diagnosed on 08/15/14

## 2015-10-18 ENCOUNTER — Encounter: Payer: Self-pay | Admitting: *Deleted

## 2015-11-15 ENCOUNTER — Emergency Department (HOSPITAL_COMMUNITY): Payer: Medicaid Other

## 2015-11-15 ENCOUNTER — Emergency Department (HOSPITAL_COMMUNITY)
Admission: EM | Admit: 2015-11-15 | Discharge: 2015-11-15 | Disposition: A | Discharge status: Home / Self Care | Payer: Medicaid Other | Attending: Physician Assistant | Admitting: Physician Assistant

## 2015-11-15 ENCOUNTER — Encounter (HOSPITAL_COMMUNITY): Payer: Self-pay | Admitting: Emergency Medicine

## 2015-11-15 DIAGNOSIS — S59911A Unspecified injury of right forearm, initial encounter: Secondary | ICD-10-CM | POA: Insufficient documentation

## 2015-11-15 DIAGNOSIS — Y9351 Activity, roller skating (inline) and skateboarding: Secondary | ICD-10-CM | POA: Insufficient documentation

## 2015-11-15 DIAGNOSIS — S060X0A Concussion without loss of consciousness, initial encounter: Secondary | ICD-10-CM

## 2015-11-15 DIAGNOSIS — Z8659 Personal history of other mental and behavioral disorders: Secondary | ICD-10-CM | POA: Insufficient documentation

## 2015-11-15 DIAGNOSIS — Y998 Other external cause status: Secondary | ICD-10-CM | POA: Insufficient documentation

## 2015-11-15 DIAGNOSIS — J45909 Unspecified asthma, uncomplicated: Secondary | ICD-10-CM | POA: Insufficient documentation

## 2015-11-15 DIAGNOSIS — Z8742 Personal history of other diseases of the female genital tract: Secondary | ICD-10-CM | POA: Insufficient documentation

## 2015-11-15 DIAGNOSIS — Z79899 Other long term (current) drug therapy: Secondary | ICD-10-CM | POA: Insufficient documentation

## 2015-11-15 DIAGNOSIS — W01198A Fall on same level from slipping, tripping and stumbling with subsequent striking against other object, initial encounter: Secondary | ICD-10-CM | POA: Insufficient documentation

## 2015-11-15 DIAGNOSIS — Y92480 Sidewalk as the place of occurrence of the external cause: Secondary | ICD-10-CM | POA: Insufficient documentation

## 2015-11-15 DIAGNOSIS — S99911A Unspecified injury of right ankle, initial encounter: Secondary | ICD-10-CM | POA: Insufficient documentation

## 2015-11-15 DIAGNOSIS — S6991XA Unspecified injury of right wrist, hand and finger(s), initial encounter: Secondary | ICD-10-CM | POA: Insufficient documentation

## 2015-11-15 DIAGNOSIS — F17210 Nicotine dependence, cigarettes, uncomplicated: Secondary | ICD-10-CM | POA: Insufficient documentation

## 2015-11-15 DIAGNOSIS — S0512XA Contusion of eyeball and orbital tissues, left eye, initial encounter: Secondary | ICD-10-CM | POA: Insufficient documentation

## 2015-11-15 DIAGNOSIS — Z23 Encounter for immunization: Secondary | ICD-10-CM | POA: Insufficient documentation

## 2015-11-15 MED ORDER — IBUPROFEN 800 MG PO TABS
800.0000 mg | ORAL_TABLET | Freq: Once | ORAL | Status: AC
  Administered 2015-11-15: 800 mg via ORAL
  Filled 2015-11-15: qty 1

## 2015-11-15 MED ORDER — TETANUS-DIPHTH-ACELL PERTUSSIS 5-2.5-18.5 LF-MCG/0.5 IM SUSP
0.5000 mL | Freq: Once | INTRAMUSCULAR | Status: AC
  Administered 2015-11-15: 0.5 mL via INTRAMUSCULAR
  Filled 2015-11-15: qty 0.5

## 2015-11-15 NOTE — Discharge Instructions (Signed)
Follow-up with your physician for any complaints or other concerns.    Concussion, Adult A concussion, or closed-head injury, is a brain injury caused by a direct blow to the head or by a quick and sudden movement (jolt) of the head or neck. Concussions are usually not life-threatening. Even so, the effects of a concussion can be serious. If you have had a concussion before, you are more likely to experience concussion-like symptoms after a direct blow to the head.  CAUSES  Direct blow to the head, such as from running into another player during a soccer game, being hit in a fight, or hitting your head on a hard surface.  A jolt of the head or neck that causes the brain to move back and forth inside the skull, such as in a car crash. SIGNS AND SYMPTOMS The signs of a concussion can be hard to notice. Early on, they may be missed by you, family members, and health care providers. You may look fine but act or feel differently. Symptoms are usually temporary, but they may last for days, weeks, or even longer. Some symptoms may appear right away while others may not show up for hours or days. Every head injury is different. Symptoms include:  Mild to moderate headaches that will not go away.  A feeling of pressure inside your head.  Having more trouble than usual:  Learning or remembering things you have heard.  Answering questions.  Paying attention or concentrating.  Organizing daily tasks.  Making decisions and solving problems.  Slowness in thinking, acting or reacting, speaking, or reading.  Getting lost or being easily confused.  Feeling tired all the time or lacking energy (fatigued).  Feeling drowsy.  Sleep disturbances.  Sleeping more than usual.  Sleeping less than usual.  Trouble falling asleep.  Trouble sleeping (insomnia).  Loss of balance or feeling lightheaded or dizzy.  Nausea or vomiting.  Numbness or tingling.  Increased sensitivity  to:  Sounds.  Lights.  Distractions.  Vision problems or eyes that tire easily.  Diminished sense of taste or smell.  Ringing in the ears.  Mood changes such as feeling sad or anxious.  Becoming easily irritated or angry for little or no reason.  Lack of motivation.  Seeing or hearing things other people do not see or hear (hallucinations). DIAGNOSIS Your health care provider can usually diagnose a concussion based on a description of your injury and symptoms. He or she will ask whether you passed out (lost consciousness) and whether you are having trouble remembering events that happened right before and during your injury. Your evaluation might include:  A brain scan to look for signs of injury to the brain. Even if the test shows no injury, you may still have a concussion.  Blood tests to be sure other problems are not present. TREATMENT  Concussions are usually treated in an emergency department, in urgent care, or at a clinic. You may need to stay in the hospital overnight for further treatment.  Tell your health care provider if you are taking any medicines, including prescription medicines, over-the-counter medicines, and natural remedies. Some medicines, such as blood thinners (anticoagulants) and aspirin, may increase the chance of complications. Also tell your health care provider whether you have had alcohol or are taking illegal drugs. This information may affect treatment.  Your health care provider will send you home with important instructions to follow.  How fast you will recover from a concussion depends on many factors. These factors  include how severe your concussion is, what part of your brain was injured, your age, and how healthy you were before the concussion.  Most people with mild injuries recover fully. Recovery can take time. In general, recovery is slower in older persons. Also, persons who have had a concussion in the past or have other medical  problems may find that it takes longer to recover from their current injury. HOME CARE INSTRUCTIONS General Instructions  Carefully follow the directions your health care provider gave you.  Only take over-the-counter or prescription medicines for pain, discomfort, or fever as directed by your health care provider.  Take only those medicines that your health care provider has approved.  Do not drink alcohol until your health care provider says you are well enough to do so. Alcohol and certain other drugs may slow your recovery and can put you at risk of further injury.  If it is harder than usual to remember things, write them down.  If you are easily distracted, try to do one thing at a time. For example, do not try to watch TV while fixing dinner.  Talk with family members or close friends when making important decisions.  Keep all follow-up appointments. Repeated evaluation of your symptoms is recommended for your recovery.  Watch your symptoms and tell others to do the same. Complications sometimes occur after a concussion. Older adults with a brain injury may have a higher risk of serious complications, such as a blood clot on the brain.  Tell your teachers, school nurse, school counselor, coach, athletic trainer, or work Freight forwarder about your injury, symptoms, and restrictions. Tell them about what you can or cannot do. They should watch for:  Increased problems with attention or concentration.  Increased difficulty remembering or learning new information.  Increased time needed to complete tasks or assignments.  Increased irritability or decreased ability to cope with stress.  Increased symptoms.  Rest. Rest helps the brain to heal. Make sure you:  Get plenty of sleep at night. Avoid staying up late at night.  Keep the same bedtime hours on weekends and weekdays.  Rest during the day. Take daytime naps or rest breaks when you feel tired.  Limit activities that require a  lot of thought or concentration. These include:  Doing homework or job-related work.  Watching TV.  Working on the computer.  Avoid any situation where there is potential for another head injury (football, hockey, soccer, basketball, martial arts, downhill snow sports and horseback riding). Your condition will get worse every time you experience a concussion. You should avoid these activities until you are evaluated by the appropriate follow-up health care providers. Returning To Your Regular Activities You will need to return to your normal activities slowly, not all at once. You must give your body and brain enough time for recovery.  Do not return to sports or other athletic activities until your health care provider tells you it is safe to do so.  Ask your health care provider when you can drive, ride a bicycle, or operate heavy machinery. Your ability to react may be slower after a brain injury. Never do these activities if you are dizzy.  Ask your health care provider about when you can return to work or school. Preventing Another Concussion It is very important to avoid another brain injury, especially before you have recovered. In rare cases, another injury can lead to permanent brain damage, brain swelling, or death. The risk of this is greatest during the  first 7-10 days after a head injury. Avoid injuries by:  Wearing a seat belt when riding in a car.  Drinking alcohol only in moderation.  Wearing a helmet when biking, skiing, skateboarding, skating, or doing similar activities.  Avoiding activities that could lead to a second concussion, such as contact or recreational sports, until your health care provider says it is okay.  Taking safety measures in your home.  Remove clutter and tripping hazards from floors and stairways.  Use grab bars in bathrooms and handrails by stairs.  Place non-slip mats on floors and in bathtubs.  Improve lighting in dim areas. SEEK MEDICAL  CARE IF:  You have increased problems paying attention or concentrating.  You have increased difficulty remembering or learning new information.  You need more time to complete tasks or assignments than before.  You have increased irritability or decreased ability to cope with stress.  You have more symptoms than before. Seek medical care if you have any of the following symptoms for more than 2 weeks after your injury:  Lasting (chronic) headaches.  Dizziness or balance problems.  Nausea.  Vision problems.  Increased sensitivity to noise or light.  Depression or mood swings.  Anxiety or irritability.  Memory problems.  Difficulty concentrating or paying attention.  Sleep problems.  Feeling tired all the time. SEEK IMMEDIATE MEDICAL CARE IF:  You have severe or worsening headaches. These may be a sign of a blood clot in the brain.  You have weakness (even if only in one hand, leg, or part of the face).  You have numbness.  You have decreased coordination.  You vomit repeatedly.  You have increased sleepiness.  One pupil is larger than the other.  You have convulsions.  You have slurred speech.  You have increased confusion. This may be a sign of a blood clot in the brain.  You have increased restlessness, agitation, or irritability.  You are unable to recognize people or places.  You have neck pain.  It is difficult to wake you up.  You have unusual behavior changes.  You lose consciousness. MAKE SURE YOU:  Understand these instructions.  Will watch your condition.  Will get help right away if you are not doing well or get worse.   This information is not intended to replace advice given to you by your health care provider. Make sure you discuss any questions you have with your health care provider.   Document Released: 10/25/2003 Document Revised: 08/25/2014 Document Reviewed: 02/24/2013 Elsevier Interactive Patient Education 2016  Else[employer]  Post-Concussion Syndrome Post-concussion syndrome describes the symptoms that can occur after a head injury. These symptoms can last from weeks to months. CAUSES  It is not clear why some head injuries cause post-concussion syndrome. It can occur whether your head injury was mild or severe and whether you were wearing head protection or not.  SIGNS AND SYMPTOMS  Memory difficulties.  Dizziness.  Headaches.  Double vision or blurry vision.  Sensitivity to light.  Hearing difficulties.  Depression.  Tiredness.  Weakness.  Difficulty with concentration.  Difficulty sleeping or staying asleep.  Vomiting.  Poor balance or instability on your feet.  Slow reaction time.  Difficulty learning and remembering things you have heard. DIAGNOSIS  There is no test to determine whether you have post-concussion syndrome. Your health care provider may order an imaging scan of your brain, such as a CT scan, to check for other problems that may be causing your symptoms (such as a  severe injury inside your skull). TREATMENT  Usually, these problems disappear over time without medical care. Your health care provider may prescribe medicine to help ease your symptoms. It is important to follow up with a neurologist to evaluate your recovery and address any lingering symptoms or issues. HOME CARE INSTRUCTIONS   Take medicines only as directed by your health care provider. Do not take aspirin. Aspirin can slow blood clotting.  Sleep with your head slightly elevated to help with headaches.  Avoid any situation where there is potential for another head injury. This includes football, hockey, soccer, basketball, martial arts, downhill snow sports, and horseback riding. Your condition will get worse every time you experience a concussion. You should avoid these activities until you are evaluated by the appropriate follow-up health care providers.  Keep all follow-up visits as  directed by your health care provider. This is important. SEEK MEDICAL CARE IF:  You have increased problems paying attention or concentrating.  You have increased difficulty remembering or learning new information.  You need more time to complete tasks or assignments than before.  You have increased irritability or decreased ability to cope with stress.  You have more symptoms than before. Seek medical care if you have any of the following symptoms for more than two weeks after your injury:  Lasting (chronic) headaches.  Dizziness or balance problems.  Nausea.  Vision problems.  Increased sensitivity to noise or light.  Depression or mood swings.  Anxiety or irritability.  Memory problems.  Difficulty concentrating or paying attention.  Sleep problems.  Feeling tired all the time. SEEK IMMEDIATE MEDICAL CARE IF:  You have confusion or unusual drowsiness.  Others find it difficult to wake you up.  You have nausea or persistent, forceful vomiting.  You feel like you are moving when you are not (vertigo). Your eyes may move rapidly back and forth.  You have convulsions or faint.  You have severe, persistent headaches that are not relieved by medicine.  You cannot use your arms or legs normally.  One of your pupils is larger than the other.  You have clear or bloody discharge from your nose or ears.  Your problems are getting worse, not better. MAKE SURE YOU:  Understand these instructions.  Will watch your condition.  Will get help right away if you are not doing well or get worse.   This information is not intended to replace advice given to you by your health care provider. Make sure you discuss any questions you have with your health care provider.   Document Released: 01/24/2002 Document Revised: 08/25/2014 Document Reviewed: 11/09/2013 Elsevier Interactive Patient Education [employer] Elsevier Inc.

## 2015-11-15 NOTE — ED Provider Notes (Signed)
CSN: [id number]     Arrival date & time 11/15/15  1001 History   First MD Initiated Contact with Patient 11/15/15 1119     Chief Complaint  Patient presents with  . Fall  . Head Injury     (Consider location/radiation/quality/duration/timing/severity/associated sxs/prior Treatment) HPI   Patient is a 20 year old female presenting after falling off her long board 2 and half days ago. Patient said she was long boarding down a hill when over a bump and fell straight forward onto her left forehead. Patient has no injuries to the right side of her face. Patient has abrasion to her left forehead with mild bruising periorbitally. Patient had no trouble with visual acuity. Patient reports mild headache and mild nausea and mild trouble  Concentrating.  She also reports mild pain in her right wrist and her right ankle. She's been ambulatory without issue.  Past Medical History  Diagnosis Date  . Asthma   . Other and unspecified ovarian cysts   . Anxiety     no med  . Depression     no med  . Lower back pain     ? DDD  . Endometriosis determined by laparoscopy 08/16/14   Past Surgical History  Procedure Laterality Date  . Tubes in ears    . Laparoscopy N/A 08/16/2014    Procedure: LAPAROSCOPY DIAGNOSTIC;  Surgeon: Allie BossierMyra C Dove, MD;  Location: [facility];  Service: Gynecology;  Laterality: N/A;   Family History  Problem Relation Age of Onset  . Endometriosis Mother   . Endometriosis Other    Social History  Substance Use Topics  . Smoking status: Current Every Day Smoker -- 0.25 packs/day for 3 years    Types: Cigarettes  . Smokeless tobacco: Never Used  . Alcohol Use: No   OB History    Gravida Para Term Preterm AB TAB SAB Ectopic Multiple Living   0 0 0 0 0 0 0 0 0 0      Review of Systems  Constitutional: Negative for fever, activity change and fatigue.  HENT: Negative for congestion, rhinorrhea and sore throat.   Eyes: Negative for discharge.  Respiratory: Negative for  shortness of breath.   Cardiovascular: Negative for chest pain.  Gastrointestinal: Negative for abdominal pain.  Musculoskeletal: Negative for back pain.  Neurological: Positive for light-headedness and headaches. Negative for weakness.  Psychiatric/Behavioral: Positive for decreased concentration. Negative for confusion.      Allergies  Review of patient's allergies indicates no known allergies.  Home Medications   Prior to Admission medications   Medication Sig Start Date End Date Taking? Authorizing Provider  albuterol (PROVENTIL HFA;VENTOLIN HFA) 108 (90 BASE) MCG/ACT inhaler Inhale 1-2 puffs into the lungs every 6 (six) hours as needed for wheezing or shortness of breath.    Historical Provider, MD  BIOTIN PO Take 1 tablet by mouth daily.    Historical Provider, MD  ibuprofen (ADVIL,MOTRIN) 600 MG tablet Take 1 tablet (600 mg total) by mouth every 6 (six) hours as needed. Take with food. 10/06/15   Currie Pariserri L Burleson, NP  IRON PO Take 1 tablet by mouth daily with breakfast.    Historical Provider, MD  Multiple Vitamins-Minerals (MULTIVITAMIN WITH MINERALS) tablet Take 1 tablet by mouth daily.    Historical Provider, MD   BP 117/70 mmHg  Pulse 100  Temp(Src) 98.2 F (36.8 C) (Oral)  Resp 18  SpO2 98%  LMP 09/18/2015 Physical Exam  Constitutional: She is oriented to person, place, and time. She appears  well-developed and well-nourished.  HENT:  Head: Normocephalic and atraumatic.  Abrasion to left forehead. Mild periorbital bruising to left. No bruising or tenderness the right-hand side. Jaw closes as usual. No loose teeth.  Eyes: Conjunctivae are normal. Right eye exhibits no discharge.  Neck: Neck supple.  Cardiovascular: Normal rate, regular rhythm and normal heart sounds.   No murmur heard. Pulmonary/Chest: Effort normal and breath sounds normal. She has no wheezes. She has no rales.  Abdominal: Soft. She exhibits no distension. There is no tenderness.  Musculoskeletal:  Normal range of motion. She exhibits no edema.  Full range of motion in bilateral upper and lower extremities. Patient has mild tenderness in right distal forearm. No external evidence of trauma. No snuffbox tenderness.  Neurological: She is oriented to person, place, and time. No cranial nerve deficit.  Extraocular movements intact. Pupils equal reactive.  Skin: Skin is warm and dry. No rash noted. She is not diaphoretic.  No bruising to anywhere except left eye.  Psychiatric: She has a normal mood and affect. Her behavior is normal.  Nursing note and vitals reviewed.   ED Course  Procedures (including critical care time) Labs Review Labs Reviewed - No data to display  Imaging Review Ct Head Wo Contrast  11/15/2015  CLINICAL DATA:  20 year old female who fell while skating 2 days ago. Hit head against pavement. Bruising, headache, nausea, fatigue. Initial encounter. EXAM: CT HEAD WITHOUT CONTRAST CT CERVICAL SPINE WITHOUT CONTRAST TECHNIQUE: Multidetector CT imaging of the head and cervical spine was performed following the standard protocol without intravenous contrast. Multiplanar CT image reconstructions of the cervical spine were also generated. COMPARISON:  None. FINDINGS: CT HEAD FINDINGS Fluid level in the right maxillary sinus. Other Visualized paranasal sinuses and mastoids are clear. Left forehead scalp hematoma/ contusion measuring up to 6 mm in thickness. Underlying left frontal bone intact. Calvarium elsewhere intact. Visualized orbit soft tissues are within normal limits. No midline shift, ventriculomegaly, mass effect, evidence of mass lesion, intracranial hemorrhage or evidence of cortically based acute infarction. Gray-white matter differentiation is within normal limits throughout the brain. No suspicious intracranial vascular hyperdensity. CT CERVICAL SPINE FINDINGS Straightening of cervical lordosis. Visualized skull base is intact. No atlanto-occipital dissociation.  Cervicothoracic junction alignment is within normal limits. Bilateral posterior element alignment is within normal limits. No cervical spine fracture. Visible upper thoracic levels appear intact. Negative lung apices.  Negative noncontrast paraspinal soft tissues. IMPRESSION: 1. Left forehead scalp contusion/hematoma without underlying fracture. 2.  Normal noncontrast CT appearance of the brain. 3. No acute fracture or listhesis identified in the cervical spine. Ligamentous injury is not excluded. 4. Partially visible fluid level in the right maxillary sinus. This could be inflammatory but if there is right facial pain recommend follow-up noncontrast face CT. Electronically Signed   By: Odessa Fleming M.D.   On: 11/15/2015 11:31   Ct Cervical Spine Wo Contrast  11/15/2015  CLINICAL DATA:  20 year old female who fell while skating 2 days ago. Hit head against pavement. Bruising, headache, nausea, fatigue. Initial encounter. EXAM: CT HEAD WITHOUT CONTRAST CT CERVICAL SPINE WITHOUT CONTRAST TECHNIQUE: Multidetector CT imaging of the head and cervical spine was performed following the standard protocol without intravenous contrast. Multiplanar CT image reconstructions of the cervical spine were also generated. COMPARISON:  None. FINDINGS: CT HEAD FINDINGS Fluid level in the right maxillary sinus. Other Visualized paranasal sinuses and mastoids are clear. Left forehead scalp hematoma/ contusion measuring up to 6 mm in thickness. Underlying left frontal bone intact.  Calvarium elsewhere intact. Visualized orbit soft tissues are within normal limits. No midline shift, ventriculomegaly, mass effect, evidence of mass lesion, intracranial hemorrhage or evidence of cortically based acute infarction. Gray-white matter differentiation is within normal limits throughout the brain. No suspicious intracranial vascular hyperdensity. CT CERVICAL SPINE FINDINGS Straightening of cervical lordosis. Visualized skull base is intact. No  atlanto-occipital dissociation. Cervicothoracic junction alignment is within normal limits. Bilateral posterior element alignment is within normal limits. No cervical spine fracture. Visible upper thoracic levels appear intact. Negative lung apices.  Negative noncontrast paraspinal soft tissues. IMPRESSION: 1. Left forehead scalp contusion/hematoma without underlying fracture. 2.  Normal noncontrast CT appearance of the brain. 3. No acute fracture or listhesis identified in the cervical spine. Ligamentous injury is not excluded. 4. Partially visible fluid level in the right maxillary sinus. This could be inflammatory but if there is right facial pain recommend follow-up noncontrast face CT. Electronically Signed   By: Odessa Fleming M.D.   On: 11/15/2015 11:31   I have personally reviewed and evaluated these images and lab results as part of my medical decision-making.   EKG Interpretation None      MDM   Final diagnoses:  None  Patient's well-appearing 20 year old female presenting after fall 2 days ago. Get CT head and neck.  We'll get x-rays of right wrist and right ankle. Discussion had about symptoms of concussion. We'll give ibuprofen, T dap. Plan to have her follow-up with primary care physician.    Keena Heesch Randall An, MD 11/15/15 [phone]

## 2015-11-15 NOTE — ED Notes (Signed)
Discharge instructions and follow up care reviewed with patient. Patient verbalized understanding. 

## 2015-11-15 NOTE — ED Notes (Signed)
Pt reports fall two days ago when skating , fell forward hit head against pavement. Presents with forehead skin abrasion and left eye brow area bruising. Pt denies loc at [facility] was not [facility] for fall. Reports headache and nausea since fall. Reports been sleepier than normal since fall. Alert and oriented x 4.

## 2015-11-21 ENCOUNTER — Ambulatory Visit (INDEPENDENT_AMBULATORY_CARE_PROVIDER_SITE_OTHER): Payer: Self-pay | Admitting: *Deleted

## 2015-11-21 DIAGNOSIS — Z23 Encounter for immunization: Secondary | ICD-10-CM

## 2015-11-21 MED ORDER — HPV 9-VALENT RECOMB VACCINE IM SUSP: 0.5000 mL | Freq: Once | INTRAMUSCULAR | Status: DC

## 2015-11-21 NOTE — Progress Notes (Signed)
Pt here for HPV 9 vaccine.  First injection given was the HPV quadrivalent and the second injection given was the HPV-9.  Spoke to Dr Adrian BlackwaterStinson in regards to last injection, stated to give HPV-9 today and have pt come back in 4 months for last HPV-9 injection. HPV 9 vaccine given.

## 2015-12-30 IMAGING — US US PELVIS COMPLETE
1 series · 13 of 25 positions shown · non-contrast
Comparison: CT ABD/PELVIS W CM dated 05/19/2011

CLINICAL DATA: Pelvic pain, worse on the right.

EXAM:
TRANSABDOMINAL AND TRANSVAGINAL ULTRASOUND OF PELVIS
DOPPLER ULTRASOUND OF OVARIES
TECHNIQUE: Both transabdominal and transvaginal ultrasound examinations of the
pelvis were performed. Transabdominal technique was performed for
global imaging of the pelvis including uterus, ovaries, adnexal
regions, and pelvic cul-de-sac.
It was necessary to proceed with endovaginal exam following the
transabdominal exam to visualize the uterus, ovaries, and adnexa.
Color and duplex Doppler ultrasound was utilized to evaluate blood
flow to the ovaries.

[Series 1: us pelvis complete · 0.21mm/px · 13 of 85 slices shown]
[im 1/85]
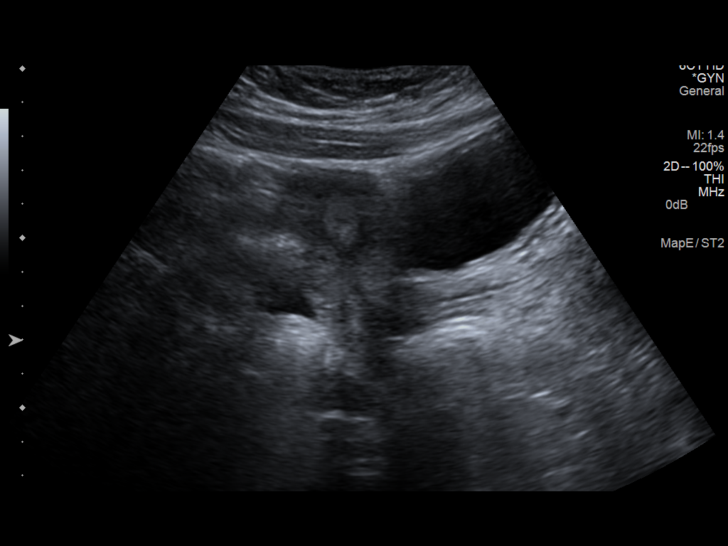
[im 8/85]
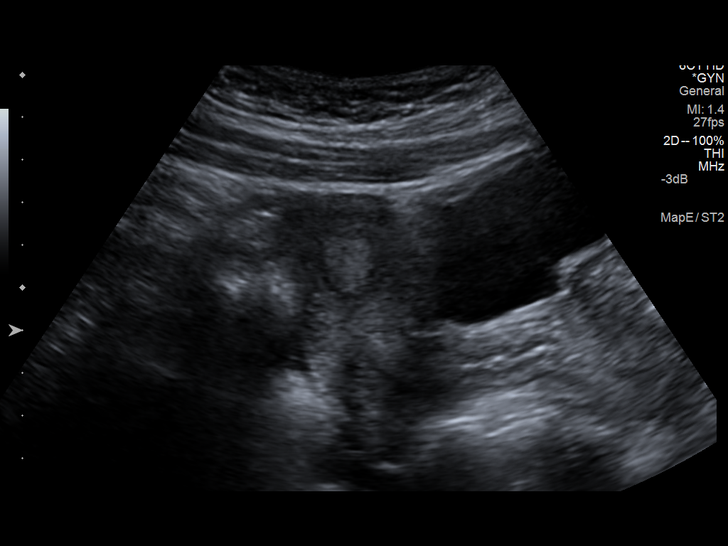
[im 15/85]
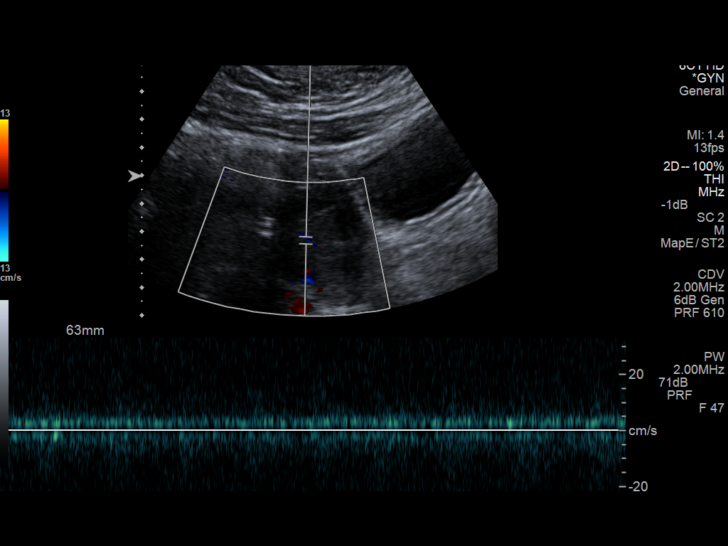
[im 22/85]
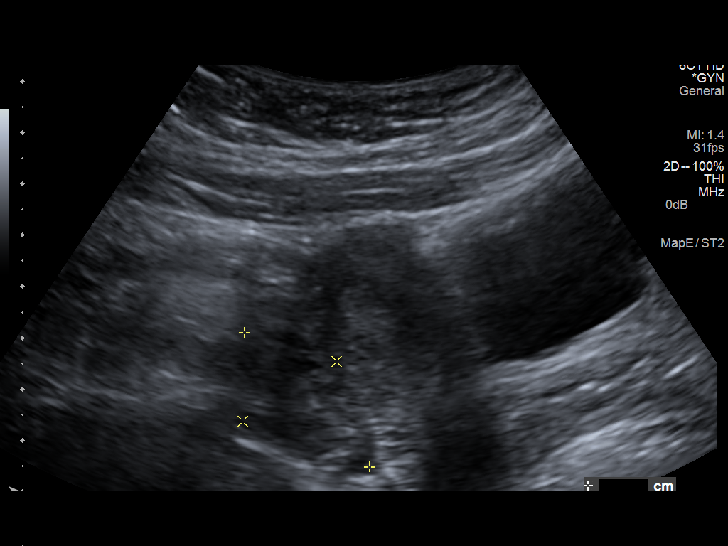
[im 29/85]
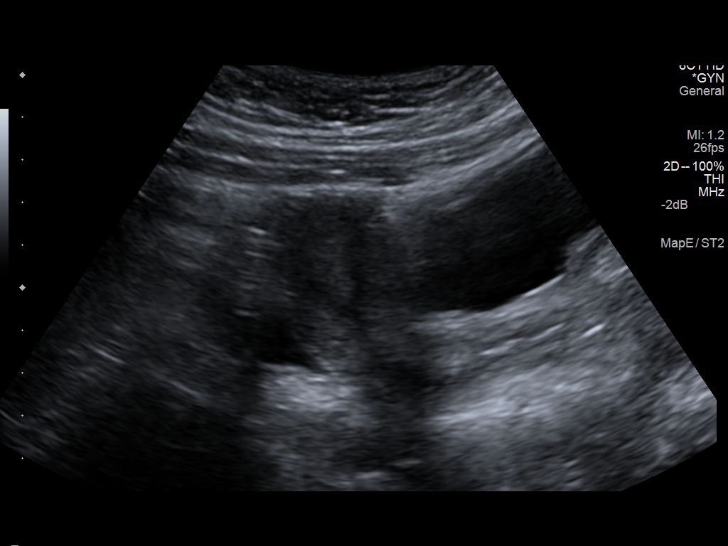
[im 36/85]
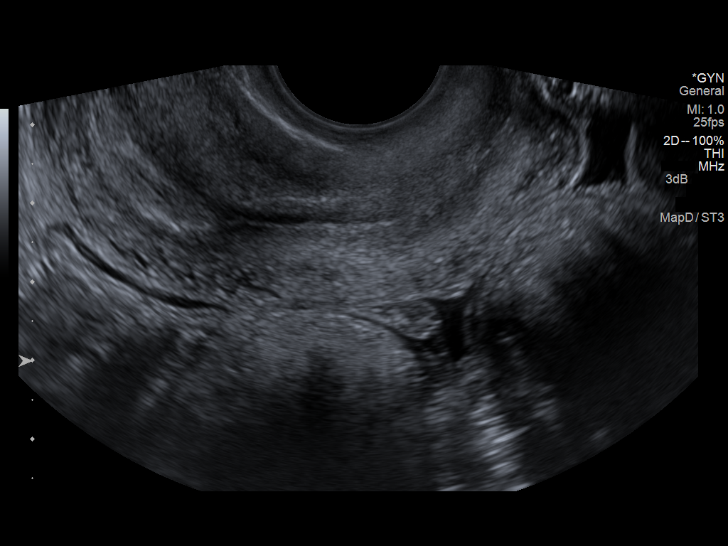
[im 43/85]
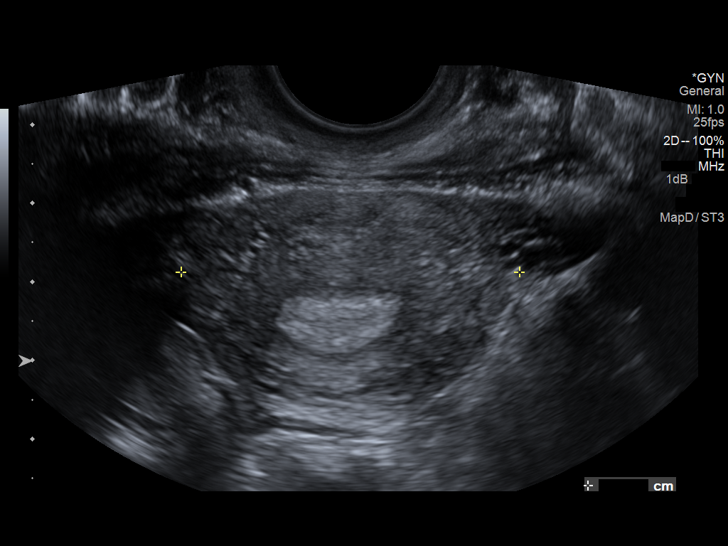
[im 50/85]
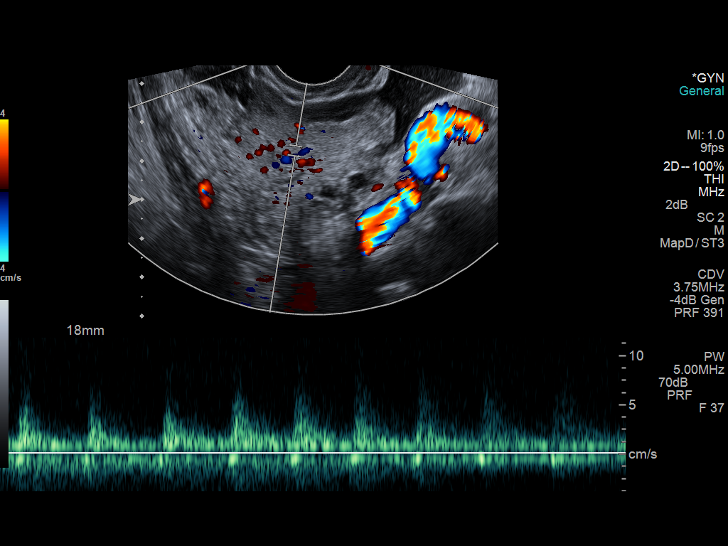
[im 57/85]
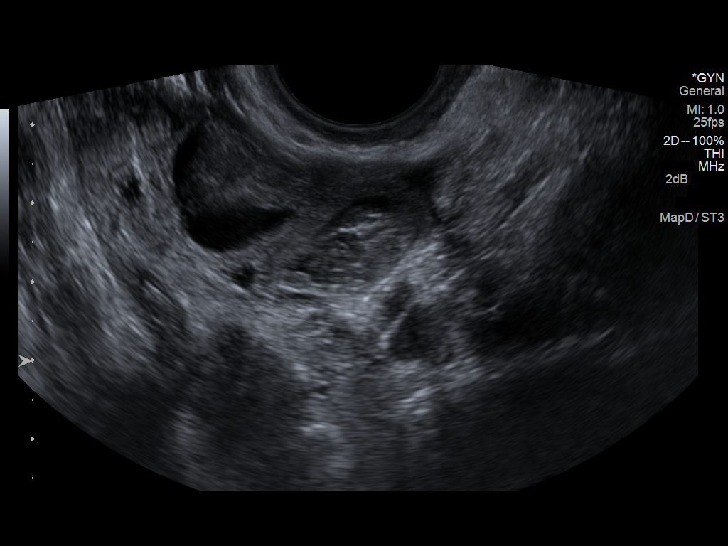
[im 64/85]
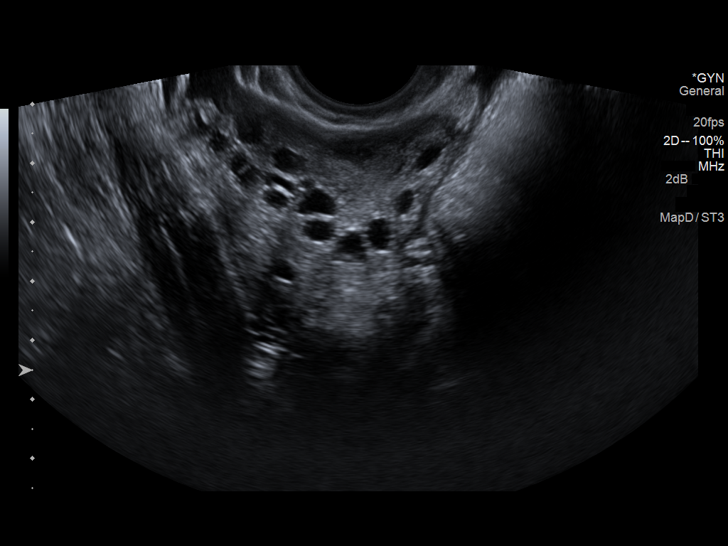
[im 71/85]
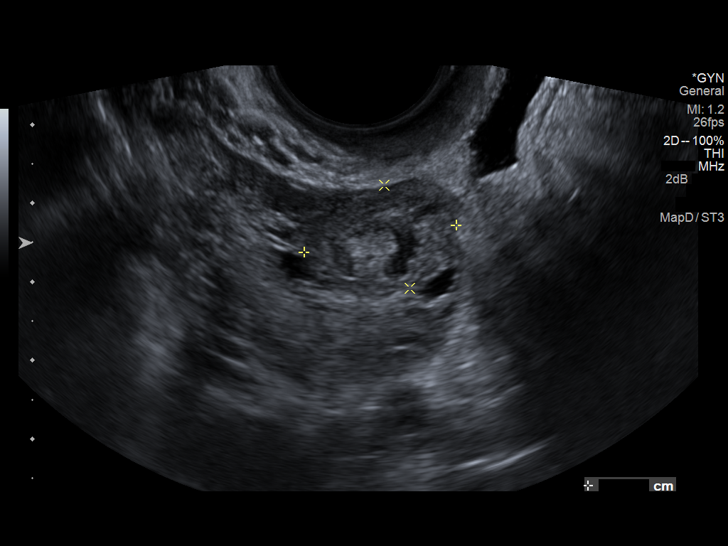
[im 78/85]
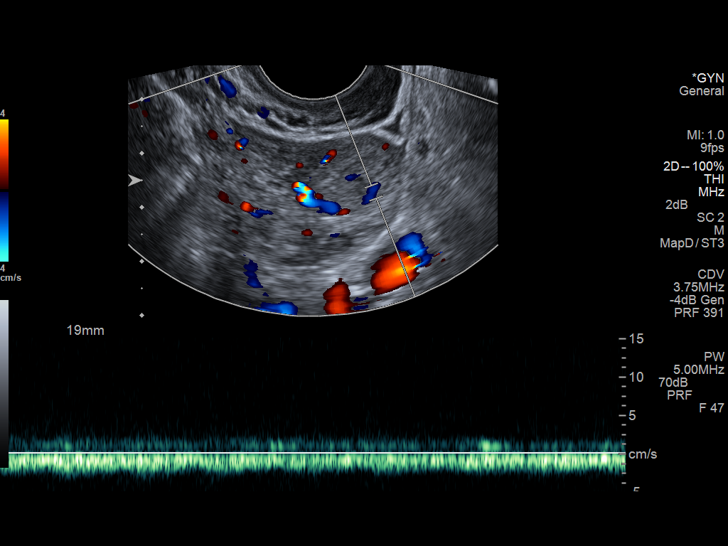
[im 85/85]
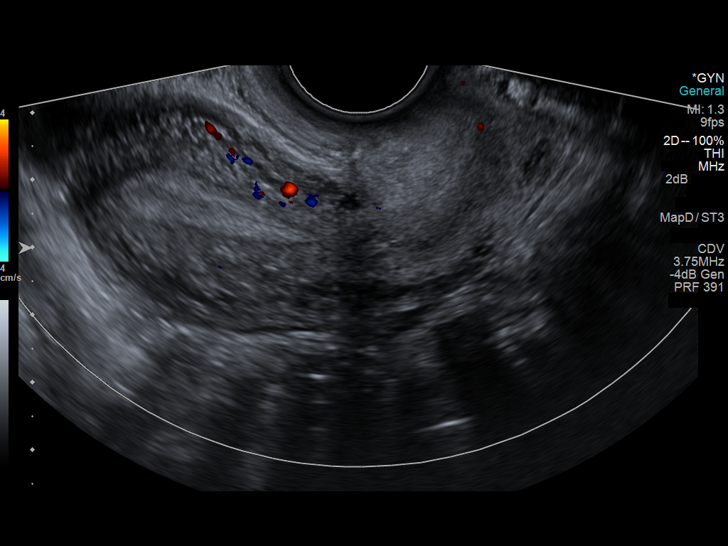

[13 of 25 positions shown; findings below may reference images not displayed]

FINDINGS: Uterus:  7.6 x 3.0 x 4.3 cm. Normal in morphology.

Endometrium:  Normal, 8 mm.

Right Ovary: 5.7 x 5.0 x 3.4 cm. Peripherally hypervascular 1.9 cm
focus in the superior aspect of the right ovary. More posterior
complex focus measures 2.2 cm. Normal color Doppler signal.

Left Ovary: 4.5 x 2.9 x 3.0 cm. Hypervascular 2.0 cm complex lesion
within. Normal color Doppler signal.

Other Findings: Small mild of pelvic fluid within the cul-de-sac and
adjacent to both ovaries.

Pulsed Doppler evaluation of both ovaries demonstrates normal
low-resistance arterial and venous waveforms.
IMPRESSION: 1. No evidence of ovarian or adnexal torsion.
2. Bilateral hypervascular complex ovarian lesions, likely corpus
luteal cysts. Fluid in the cul-de-sac and adjacent the ovaries could
relate to recent cyst rupture.

## 2016-03-24 ENCOUNTER — Ambulatory Visit (INDEPENDENT_AMBULATORY_CARE_PROVIDER_SITE_OTHER): Payer: Self-pay | Admitting: *Deleted

## 2016-03-24 DIAGNOSIS — Z23 Encounter for immunization: Secondary | ICD-10-CM

## 2016-03-24 NOTE — Progress Notes (Signed)
Pt here today for Gardasil # 3, last injection given on 11-21-15.  Gardasil given.

## 2016-04-10 ENCOUNTER — Ambulatory Visit: Payer: Medicaid Other | Admitting: Obstetrics & Gynecology

## 2016-04-24 ENCOUNTER — Encounter (HOSPITAL_COMMUNITY): Payer: Self-pay | Admitting: Emergency Medicine

## 2016-04-24 DIAGNOSIS — Z791 Long term (current) use of non-steroidal anti-inflammatories (NSAID): Secondary | ICD-10-CM | POA: Insufficient documentation

## 2016-04-24 DIAGNOSIS — F17210 Nicotine dependence, cigarettes, uncomplicated: Secondary | ICD-10-CM | POA: Insufficient documentation

## 2016-04-24 DIAGNOSIS — Z79899 Other long term (current) drug therapy: Secondary | ICD-10-CM | POA: Insufficient documentation

## 2016-04-24 DIAGNOSIS — R1010 Upper abdominal pain, unspecified: Secondary | ICD-10-CM | POA: Insufficient documentation

## 2016-04-24 DIAGNOSIS — J45909 Unspecified asthma, uncomplicated: Secondary | ICD-10-CM | POA: Insufficient documentation

## 2016-04-24 DIAGNOSIS — Z5321 Procedure and treatment not carried out due to patient leaving prior to being seen by health care provider: Secondary | ICD-10-CM | POA: Insufficient documentation

## 2016-04-24 NOTE — ED Triage Notes (Signed)
Pt states she is having generalized abd pain that started 2 to 3 hours ago  Pt states the pain is worse in the upper quadrants  Denies N/V

## 2016-04-25 ENCOUNTER — Emergency Department (HOSPITAL_COMMUNITY): Payer: Self-pay

## 2016-04-25 ENCOUNTER — Emergency Department (HOSPITAL_COMMUNITY)
Admission: EM | Admit: 2016-04-25 | Discharge: 2016-04-25 | Discharge status: Against Medical Advice | Payer: Medicaid Other | Attending: Dermatology | Admitting: Dermatology

## 2016-04-25 ENCOUNTER — Encounter (HOSPITAL_COMMUNITY): Payer: Self-pay | Admitting: Emergency Medicine

## 2016-04-25 ENCOUNTER — Emergency Department (HOSPITAL_COMMUNITY)
Admission: EM | Admit: 2016-04-25 | Discharge: 2016-04-25 | Disposition: A | Discharge status: Home / Self Care | Payer: Self-pay | Attending: Emergency Medicine | Admitting: Emergency Medicine

## 2016-04-25 DIAGNOSIS — Z79899 Other long term (current) drug therapy: Secondary | ICD-10-CM | POA: Insufficient documentation

## 2016-04-25 DIAGNOSIS — F17210 Nicotine dependence, cigarettes, uncomplicated: Secondary | ICD-10-CM | POA: Insufficient documentation

## 2016-04-25 DIAGNOSIS — K2970 Gastritis, unspecified, without bleeding: Secondary | ICD-10-CM | POA: Insufficient documentation

## 2016-04-25 DIAGNOSIS — R109 Unspecified abdominal pain: Secondary | ICD-10-CM

## 2016-04-25 DIAGNOSIS — K279 Peptic ulcer, site unspecified, unspecified as acute or chronic, without hemorrhage or perforation: Secondary | ICD-10-CM | POA: Insufficient documentation

## 2016-04-25 DIAGNOSIS — J45909 Unspecified asthma, uncomplicated: Secondary | ICD-10-CM | POA: Insufficient documentation

## 2016-04-25 DIAGNOSIS — Z791 Long term (current) use of non-steroidal anti-inflammatories (NSAID): Secondary | ICD-10-CM | POA: Insufficient documentation

## 2016-04-25 DIAGNOSIS — R1013 Epigastric pain: Secondary | ICD-10-CM

## 2016-04-25 LAB — COMPREHENSIVE METABOLIC PANEL
ALBUMIN: 4.6 g/dL (ref 3.5–5.0)
ALK PHOS: 58 U/L (ref 38–126)
ALK PHOS: 61 U/L (ref 38–126)
ALT: 16 U/L (ref 14–54)
ALT: 17 U/L (ref 14–54)
ANION GAP: 7 (ref 5–15)
AST: 18 U/L (ref 15–41)
AST: 20 U/L (ref 15–41)
Albumin: 4.8 g/dL (ref 3.5–5.0)
Anion gap: 7 (ref 5–15)
BILIRUBIN TOTAL: 0.7 mg/dL (ref 0.3–1.2)
BUN: 13 mg/dL (ref 6–20)
BUN: 15 mg/dL (ref 6–20)
CALCIUM: 9.5 mg/dL (ref 8.9–10.3)
CALCIUM: 9.5 mg/dL (ref 8.9–10.3)
CHLORIDE: 106 mmol/L (ref 101–111)
CO2: 27 mmol/L (ref 22–32)
CO2: 29 mmol/L (ref 22–32)
CREATININE: 0.63 mg/dL (ref 0.44–1.00)
CREATININE: 0.72 mg/dL (ref 0.44–1.00)
Chloride: 105 mmol/L (ref 101–111)
GFR calc Af Amer: >60 mL/min (ref >60)
GFR calc Af Amer: >60 mL/min (ref >60)
GFR calc non Af Amer: >60 mL/min (ref >60)
GFR calc non Af Amer: >60 mL/min (ref >60)
GLUCOSE: 94 mg/dL (ref 65–99)
Glucose, Bld: 90 mg/dL (ref 65–99)
Potassium: 3.9 mmol/L (ref 3.5–5.1)
Potassium: 3.9 mmol/L (ref 3.5–5.1)
SODIUM: 142 mmol/L (ref 135–145)
Sodium: 139 mmol/L (ref 135–145)
TOTAL PROTEIN: 7.4 g/dL (ref 6.5–8.1)
Total Bilirubin: 0.9 mg/dL (ref 0.3–1.2)
Total Protein: 7.2 g/dL (ref 6.5–8.1)

## 2016-04-25 LAB — I-STAT BETA HCG BLOOD, ED (MC, WL, AP ONLY)

## 2016-04-25 LAB — URINALYSIS, ROUTINE W REFLEX MICROSCOPIC
Bilirubin Urine: NEGATIVE
Glucose, UA: NEGATIVE mg/dL
Hgb urine dipstick: NEGATIVE
Ketones, ur: NEGATIVE mg/dL
LEUKOCYTES UA: NEGATIVE
Nitrite: NEGATIVE
PROTEIN: NEGATIVE mg/dL
SPECIFIC GRAVITY, URINE: 1.033 — AB (ref 1.005–1.030)
pH: 6.5 (ref 5.0–8.0)

## 2016-04-25 LAB — CBC
HCT: 43.3 % (ref 36.0–46.0)
HCT: 45 % (ref 36.0–46.0)
HEMOGLOBIN: 14.5 g/dL (ref 12.0–15.0)
Hemoglobin: 15.1 g/dL — ABNORMAL HIGH (ref 12.0–15.0)
MCH: 29.4 pg (ref 26.0–34.0)
MCH: 29.5 pg (ref 26.0–34.0)
MCHC: 33.5 g/dL (ref 30.0–36.0)
MCHC: 33.6 g/dL (ref 30.0–36.0)
MCV: 87.8 fL (ref 78.0–100.0)
MCV: 88.1 fL (ref 78.0–100.0)
PLATELETS: 216 10*3/uL (ref 150–400)
PLATELETS: 229 10*3/uL (ref 150–400)
RBC: 4.93 MIL/uL (ref 3.87–5.11)
RBC: 5.11 MIL/uL (ref 3.87–5.11)
RDW: 12.8 % (ref 11.5–15.5)
RDW: 12.9 % (ref 11.5–15.5)
WBC: 14.2 10*3/uL — ABNORMAL HIGH (ref 4.0–10.5)
WBC: 9.7 10*3/uL (ref 4.0–10.5)

## 2016-04-25 LAB — URINE MICROSCOPIC-ADD ON: Bacteria, UA: NONE SEEN

## 2016-04-25 LAB — LIPASE, BLOOD
Lipase: 25 U/L (ref 11–51)
Lipase: 25 U/L (ref 11–51)

## 2016-04-25 MED ORDER — OMEPRAZOLE 20 MG PO CPDR: 20.0000 mg | DELAYED_RELEASE_CAPSULE | Freq: Every day | ORAL | 0 refills | Status: AC

## 2016-04-25 MED ORDER — SUCRALFATE 1 G PO TABS: 1.0000 g | ORAL_TABLET | Freq: Three times a day (TID) | ORAL | 0 refills | Status: DC

## 2016-04-25 MED ORDER — ONDANSETRON 4 MG PO TBDP
4.0000 mg | ORAL_TABLET | Freq: Once | ORAL | Status: AC
  Administered 2016-04-25: 4 mg via ORAL
  Filled 2016-04-25: qty 1

## 2016-04-25 MED ORDER — GI COCKTAIL ~~LOC~~
30.0000 mL | Freq: Once | ORAL | Status: AC
  Administered 2016-04-25: 30 mL via ORAL
  Filled 2016-04-25: qty 30

## 2016-04-25 NOTE — Discharge Instructions (Signed)
Your ultrasound is normal.  You likely have either gastritis or an ulcer. Avoid any anti-inflammatory medications (ibuprofen, advil, naproxen, etc), alcohol, and fatty foods. Return for worsening symptoms, including escalating pain, intractable vomiting, fever, or any other symptoms concerning to you.

## 2016-04-25 NOTE — ED Provider Notes (Signed)
[facility]-EMERGENCY DEPT Provider Note   CSN: [id number] Arrival date & time: 04/25/16  1356     History   Chief Complaint Chief Complaint  Patient presents with  . Abdominal Pain    RUQ radiating down to pelvis    HPI Christina Contreras is a 20 y.o. female.  HPI  [age] year old with history of endometriosis s/p laprosocopy. Complains of epigastric and LUQ abdominal pain, flare up right after eating. Ongoing since late yesterday. Has had nausea and vomiting with this. No constipation, abdominal distension, diarrhea, fever. No urinary frequency or dysuria. No vaginal bleeding or discharge. Did have alcohol after onset of pain and tried to take advil.  Past Medical History:  Diagnosis Date  . Anxiety    no med  . Asthma   . Depression    no med  . Endometriosis determined by laparoscopy 08/16/14  . Lower back pain    ? DDD  . Other and unspecified ovarian cysts     Patient Active Problem List   Diagnosis Date Noted  . Endometriosis 05/14/2015    Past Surgical History:  Procedure Laterality Date  . LAPAROSCOPY N/A 08/16/2014   Procedure: LAPAROSCOPY DIAGNOSTIC;  Surgeon: Allie BossierMyra C Dove, MD;  Location: [facility];  Service: Gynecology;  Laterality: N/A;  . tubes in ears      OB History    Gravida Para Term Preterm AB Living   0 0 0 0 0 0   SAB TAB Ectopic Multiple Live Births   0 0 0 0         Home Medications    Prior to Admission medications   Medication Sig Start Date End Date Taking? Authorizing Provider  albuterol (PROVENTIL HFA;VENTOLIN HFA) 108 (90 BASE) MCG/ACT inhaler Inhale 1-2 puffs into the lungs every 6 (six) hours as needed for wheezing or shortness of breath.   Yes Historical Provider, MD  BIOTIN PO Take 1 tablet by mouth daily.   Yes Historical Provider, MD  ibuprofen (ADVIL) 200 MG tablet Take 800 mg by mouth every 6 (six) hours as needed for moderate pain.   Yes Historical Provider, MD  IRON PO Take 1 tablet by mouth daily with breakfast.   Yes Historical  Provider, MD  Levonorgestrel (SKYLA) 13.5 MG IUD 1 each by Intrauterine route continuous.   Yes Historical Provider, MD  Multiple Vitamins-Minerals (MULTIVITAMIN WITH MINERALS) tablet Take 1 tablet by mouth daily.   Yes Historical Provider, MD  ibuprofen (ADVIL,MOTRIN) 600 MG tablet Take 1 tablet (600 mg total) by mouth every 6 (six) hours as needed. Take with food. Patient not taking: Reported on 04/25/2016 10/06/15   Currie Pariserri L Burleson, NP  omeprazole (PRILOSEC) 20 MG capsule Take 1 capsule (20 mg total) by mouth daily. 04/25/16   Lavera Guiseana Duo Kanisha Duba, MD  sucralfate (CARAFATE) 1 g tablet Take 1 tablet (1 g total) by mouth 4 (four) times daily -  with meals and at bedtime. 04/25/16   Lavera Guiseana Duo Cain Fitzhenry, MD    Family History Family History  Problem Relation Age of Onset  . Endometriosis Mother   . Endometriosis Other     Social History Social History  Substance Use Topics  . Smoking status: Current Every Day Smoker    Packs/day: 0.25    Years: 3.00    Types: Cigarettes  . Smokeless tobacco: Never Used  . Alcohol use 0.0 oz/week     Allergies   Review of patient's allergies indicates no known allergies.   Review of Systems  Review of Systems 10/14 systems reviewed and are negative other than those stated in the HPI   Physical Exam Updated Vital Signs BP (!) 102/54 (BP Location: Left Arm)   Pulse 81   Temp 98.1 F (36.7 C) (Oral)   Resp 18   Ht 5\' 7"  (1.702 m)   Wt 166 lb (75.3 kg)   LMP 08/30/2015   SpO2 100%   BMI 26.00 kg/m   Physical Exam Physical Exam  Nursing note and vitals reviewed. Constitutional: Well developed, well nourished, non-toxic, and in no acute distress Head: Normocephalic and atraumatic.  Mouth/Throat: Oropharynx is clear and moist.  Neck: Normal range of motion. Neck supple.  Cardiovascular: Normal rate and regular rhythm.   Pulmonary/Chest: Effort normal and breath sounds normal.  Abdominal: Soft. There is epigastric tenderness. Minimal ruq and luq  tenderness. There is no rebound and no guarding.  Musculoskeletal: Normal range of motion.  Neurological: Alert, no facial droop, fluent speech, moves all extremities symmetrically Skin: Skin is warm and dry.  Psychiatric: Cooperative   ED Treatments / Results  Labs (all labs ordered are listed, but only abnormal results are displayed) Labs Reviewed  URINALYSIS, ROUTINE W REFLEX MICROSCOPIC (NOT AT [facility]) - Abnormal; Notable for the following:       Result Value   APPearance TURBID (*)    Specific Gravity, Urine 1.033 (*)    All other components within normal limits  URINE MICROSCOPIC-ADD ON - Abnormal; Notable for the following:    Squamous Epithelial / LPF 6-30 (*)    All other components within normal limits  LIPASE, BLOOD  COMPREHENSIVE METABOLIC PANEL  CBC  I-STAT BETA HCG BLOOD, ED ([facility], [facility], [facility] ONLY)    EKG  EKG Interpretation None       Radiology US Abdomen Limited Ruq  Result Date: 04/25/2016 CLINICAL DATA:  Right upper quadrant pain EXAM: US ABDOMEN LIMITED - RIGHT UPPER QUADRANT COMPARISON:  None. FINDINGS: Gallbladder: No gallstones or wall thickening visualized. No sonographic Murphy sign noted by sonographer. Common bile duct: Diameter: 3.2 mm in diameter within normal limits Liver: No focal lesion identified. Within normal limits in parenchymal echogenicity. IMPRESSION: Unremarkable right upper quadrant ultrasound Electronically Signed   By: Natasha Mead M.D.   On: 04/25/2016 21:12    Procedures Procedures (including critical care time)  Medications Ordered in ED Medications  gi cocktail (Maalox,Lidocaine,Donnatal) (30 mLs Oral Given 04/25/16 2049)  ondansetron (ZOFRAN-ODT) disintegrating tablet 4 mg (4 mg Oral Given 04/25/16 2048)     Initial Impression / Assessment and Plan / ED Course  I have reviewed the triage vital signs and the nursing notes.  Pertinent labs & imaging results that were available during my care of the patient were reviewed by me and  considered in my medical decision making (see chart for details).  Clinical Course    20 year old female with history of endometriosis who presents with epigastric abdominal pain, worse after eating. Well-appearing and in no acute distress with overall soft and nonsurgical abdomen. Afebrile and hemodynamically stable. Normal lipase, an unremarkable CBC and comprehensive metabolic profile. UA unremarkable and she is not pregnant. Right upper quadrant ultrasound without any hepatobiliary disease. Suspect the symptoms are likely related to gastritis versus peptic ulcer disease. Symptoms improved with GI cocktail. I discussed supportive care instructions for home. Strict return and follow-up instructions reviewed. She expressed understanding of all discharge instructions and felt comfortable with the plan of care.   Final Clinical Impressions(s) / ED Diagnoses   Final  diagnoses:  Abdominal pain  Gastritis  Epigastric abdominal pain  PUD (peptic ulcer disease)    New Prescriptions New Prescriptions   OMEPRAZOLE (PRILOSEC) 20 MG CAPSULE    Take 1 capsule (20 mg total) by mouth daily.   SUCRALFATE (CARAFATE) 1 G TABLET    Take 1 tablet (1 g total) by mouth 4 (four) times daily -  with meals and at bedtime.     Lavera Guise, MD 04/25/16 [phone]

## 2016-04-25 NOTE — ED Notes (Signed)
Pt states her pain had eased and she was leaving

## 2016-04-25 NOTE — ED Triage Notes (Signed)
Pt presents to ED with complaints of RUQ abd pain. Pt states she was here last night for the same reason but decided to leave when the pain had subsided. Pt denies any change in eating habits or medications. Pt states " the pain starts in my abdomen and radiates down to my pelvis." Currently rating pain 6/10.

## 2016-12-07 IMAGING — US US ABDOMEN LIMITED
1 series · 14 of 25 positions shown · non-contrast
Comparison: None.

CLINICAL DATA: Right upper quadrant pain

EXAM:
US ABDOMEN LIMITED - RIGHT UPPER QUADRANT

[Series 1: us abdomen limited · 0.23mm/px · 14 of 56 slices shown]
[im 1/56]
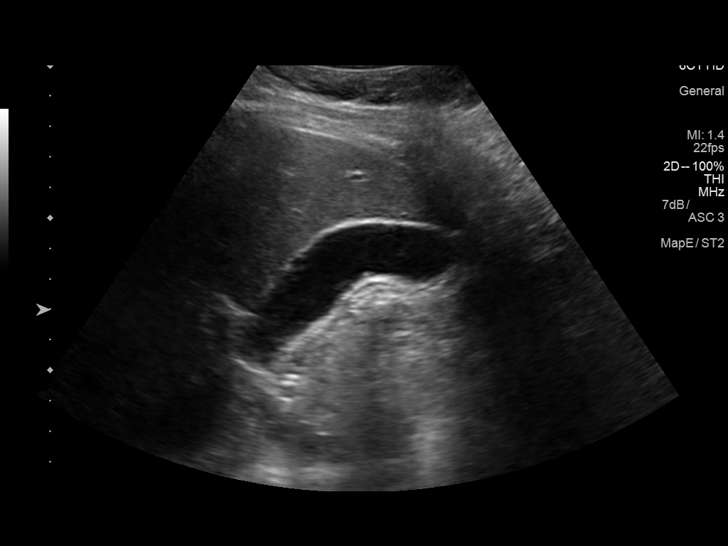
[im 5/56]
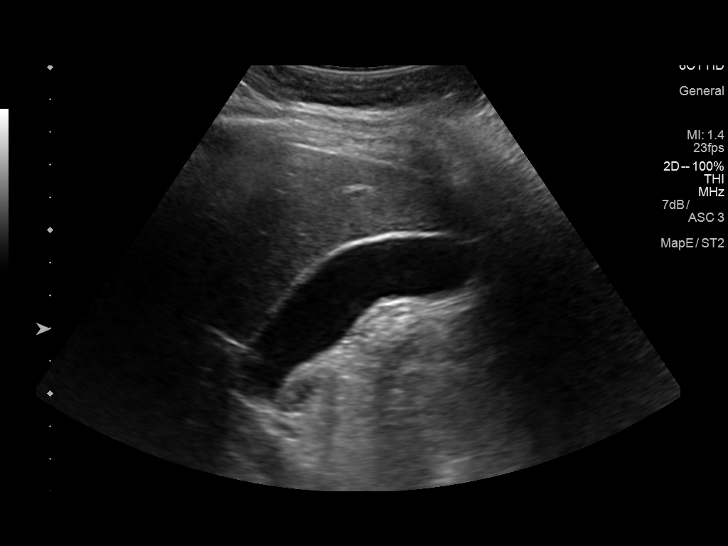
[im 10/56]
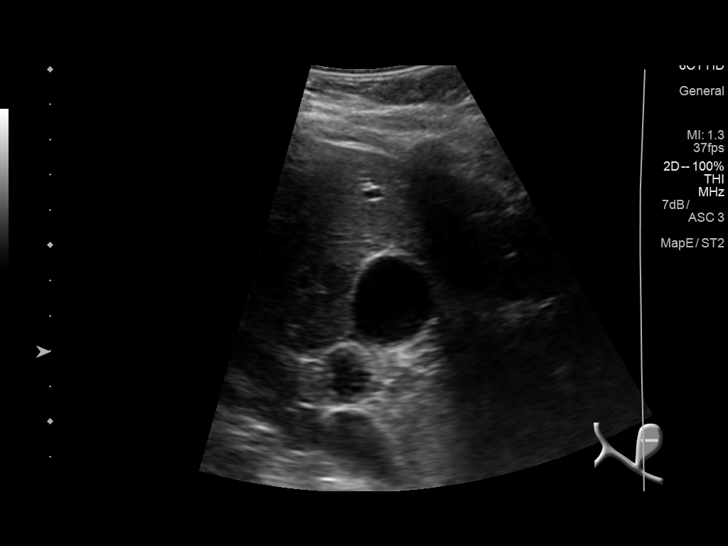
[im 14/56]
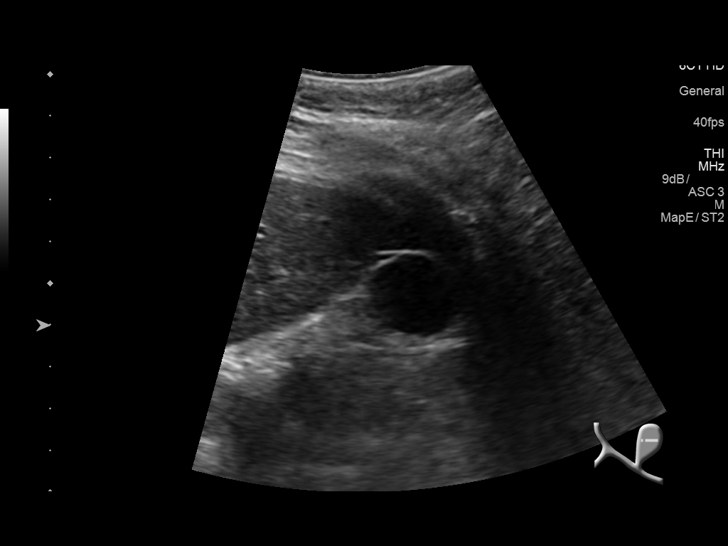
[im 19/56]
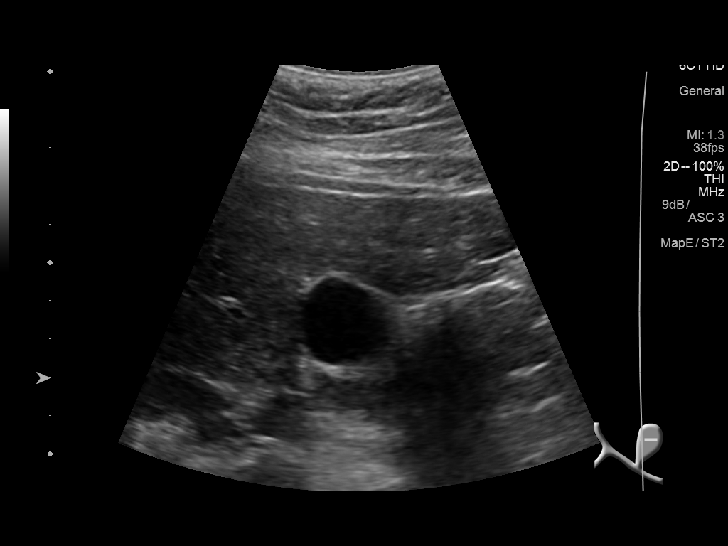
[im 21/56]
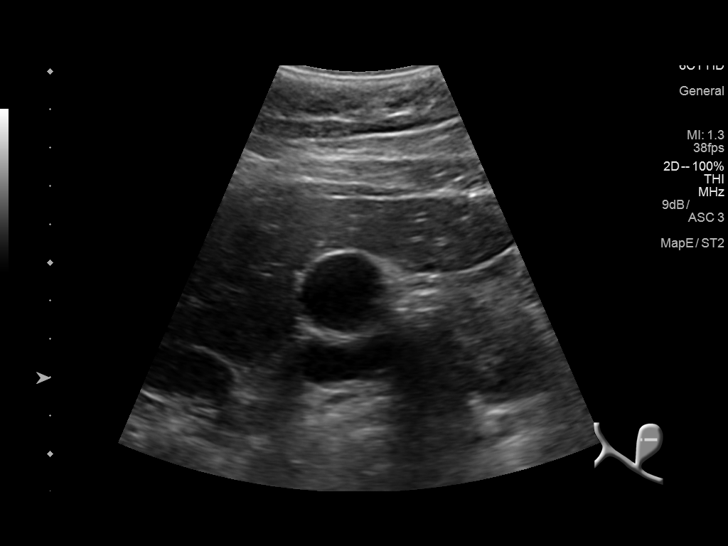
[im 26/56]
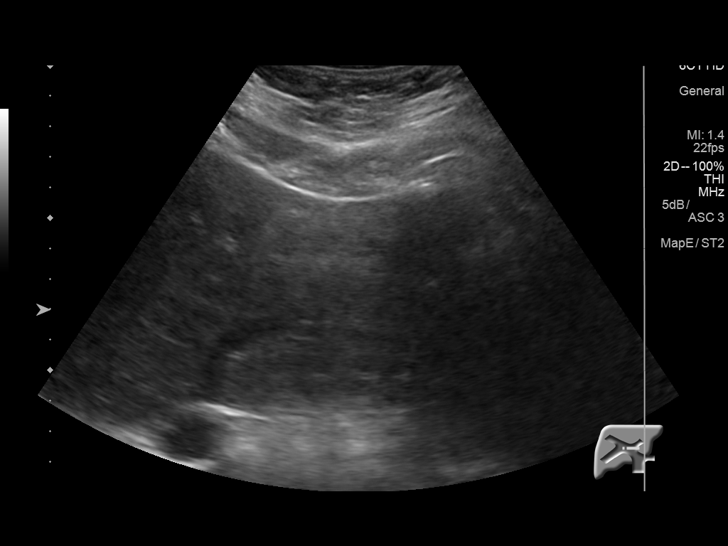
[im 30/56]
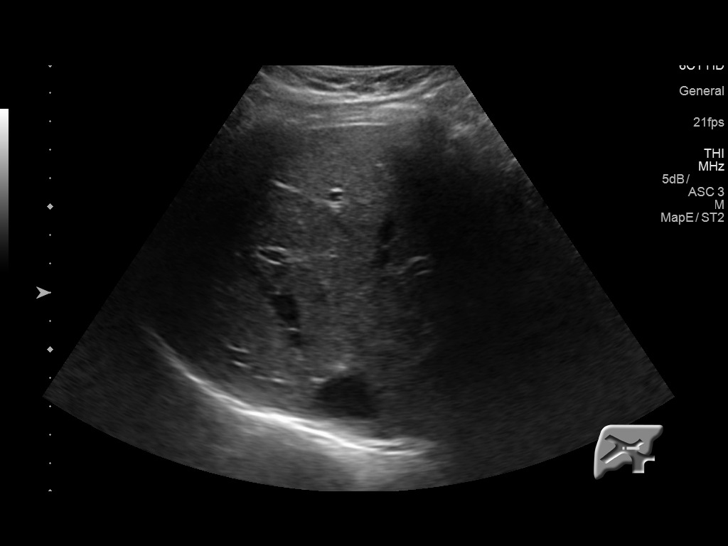
[im 35/56]
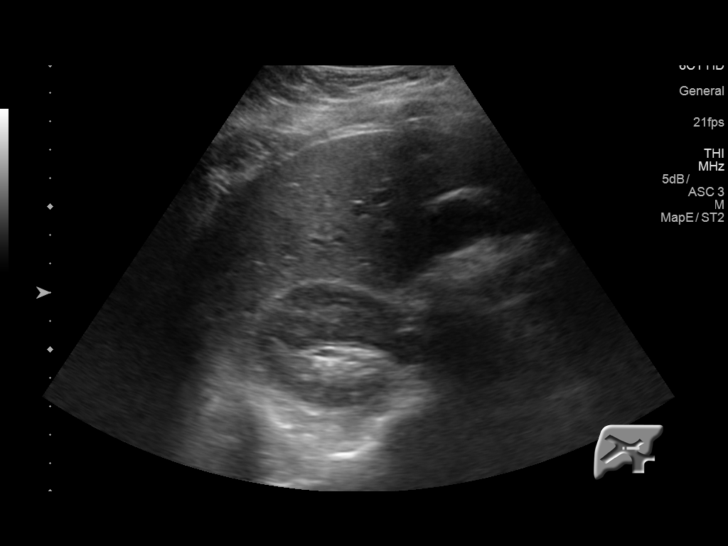
[im 37/56]
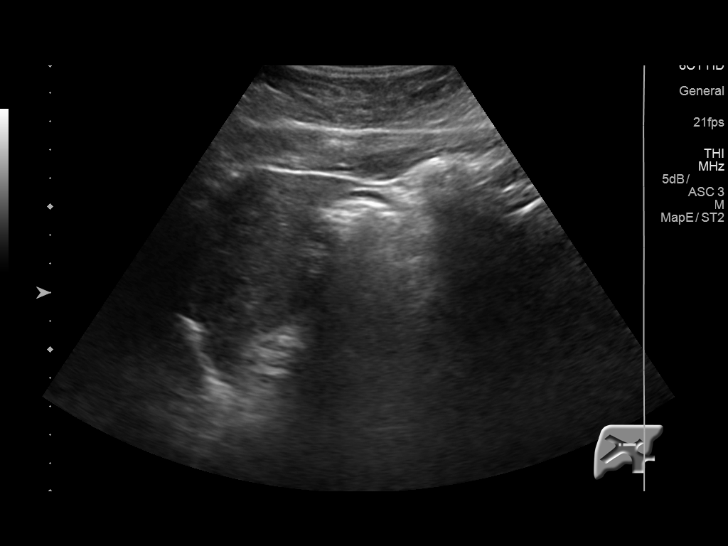
[im 42/56]
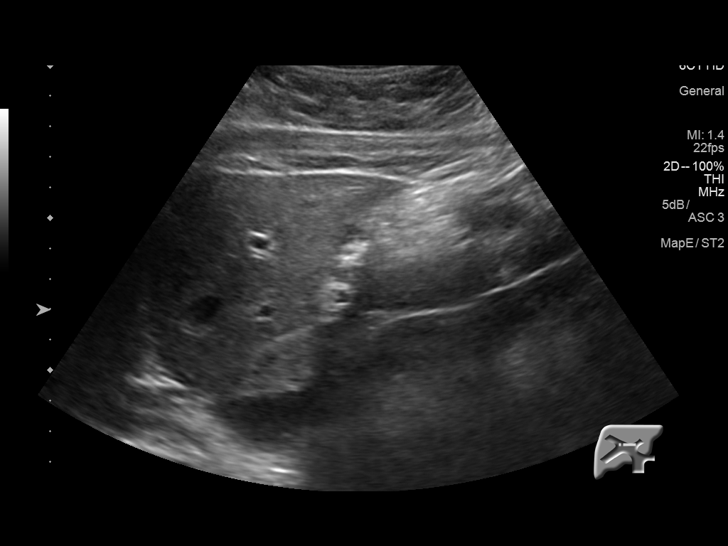
[im 46/56]
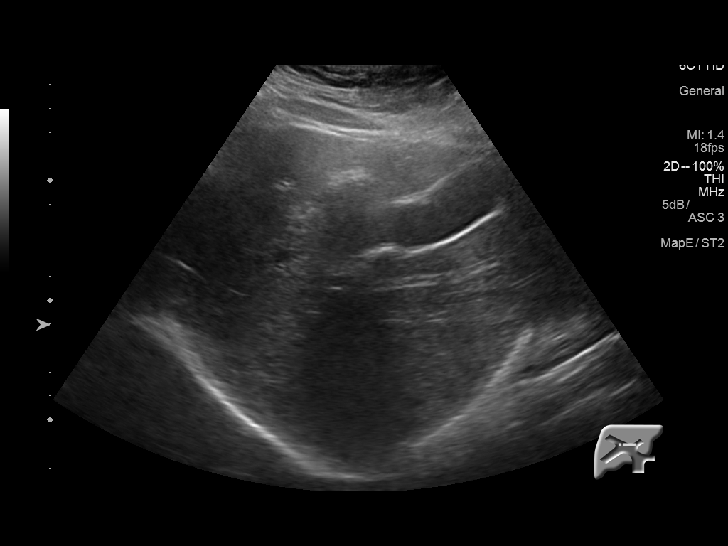
[im 51/56]
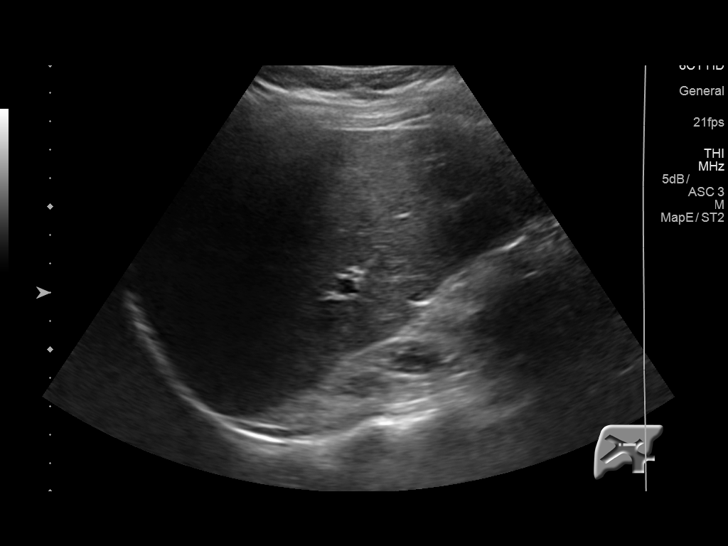
[im 56/56]
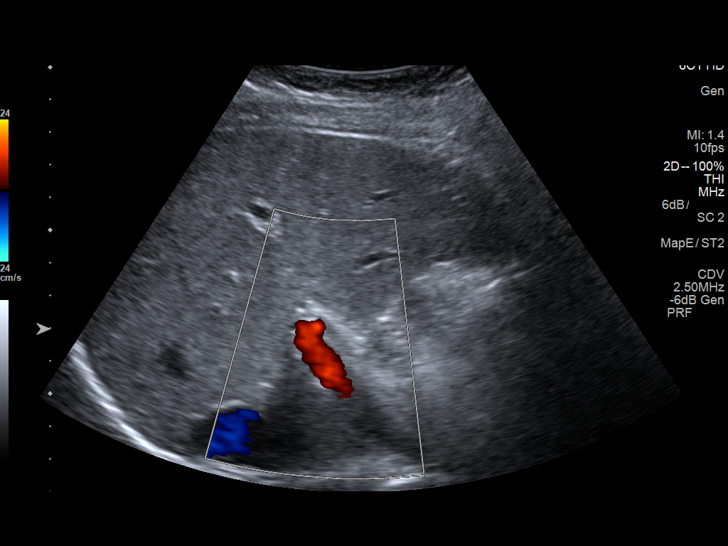

[14 of 25 positions shown; findings below may reference images not displayed]

FINDINGS: Gallbladder:

No gallstones or wall thickening visualized. No sonographic Murphy
sign noted by sonographer.

Common bile duct:

Diameter: 3.2 mm in diameter within normal limits

Liver:

No focal lesion identified. Within normal limits in parenchymal
echogenicity.
IMPRESSION: Unremarkable right upper quadrant ultrasound

## 2017-07-18 IMAGING — US US PELVIS COMPLETE
1 series · 15 of 25 positions shown · non-contrast
Comparison: None

CLINICAL DATA: Endometriosis.  Pelvic pain for 4 weeks.

EXAM:
TRANSABDOMINAL AND TRANSVAGINAL ULTRASOUND OF PELVIS
TECHNIQUE: Both transabdominal and transvaginal ultrasound examinations of the
pelvis were performed. Transabdominal technique was performed for
global imaging of the pelvis including uterus, ovaries, adnexal
regions, and pelvic cul-de-sac. It was necessary to proceed with
endovaginal exam following the transabdominal exam to visualize the
uterus and ovaries.

[Series 1: us pelvis complete · 15 of 69 slices shown]
[im 1/69]
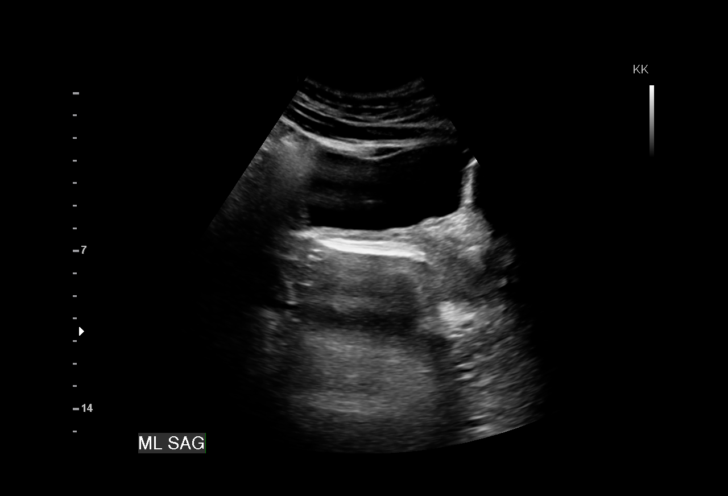
[im 6/69]
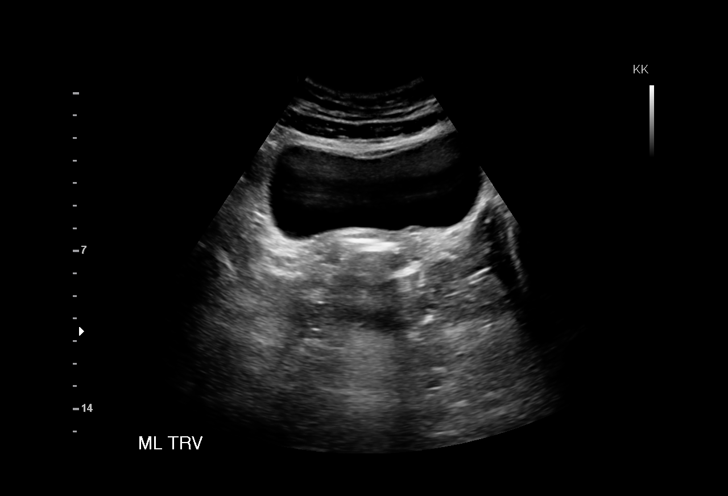
[im 12/69]
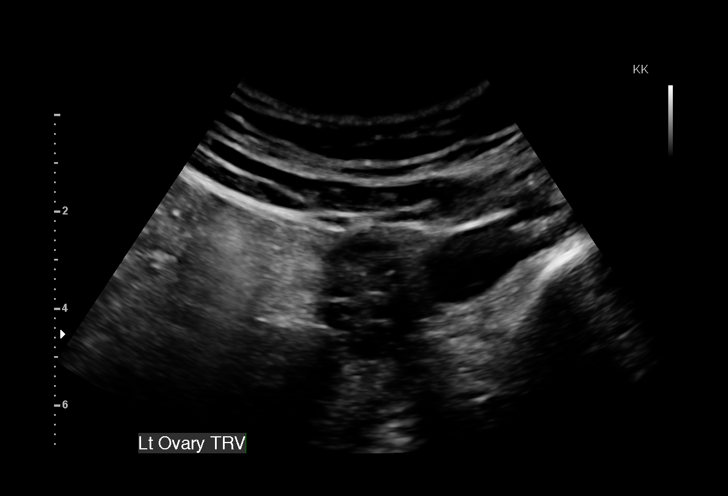
[im 15/69]
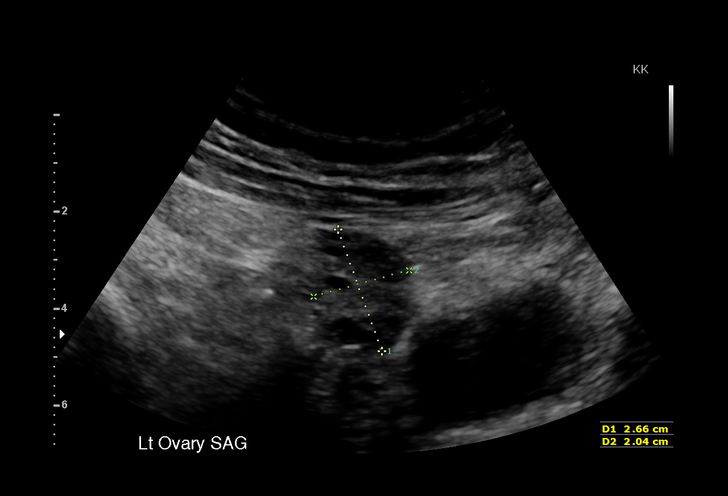
[im 20/69]
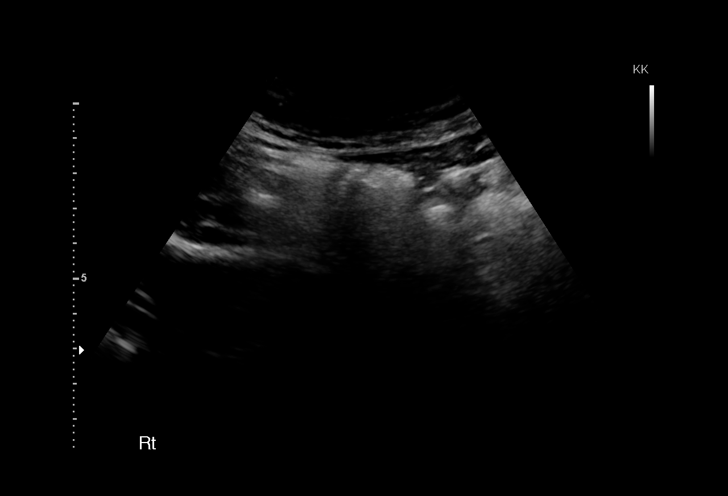
[im 26/69]
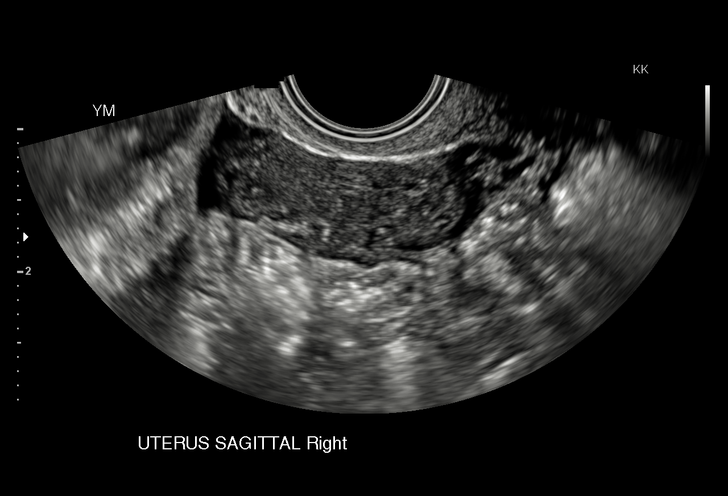
[im 29/69]
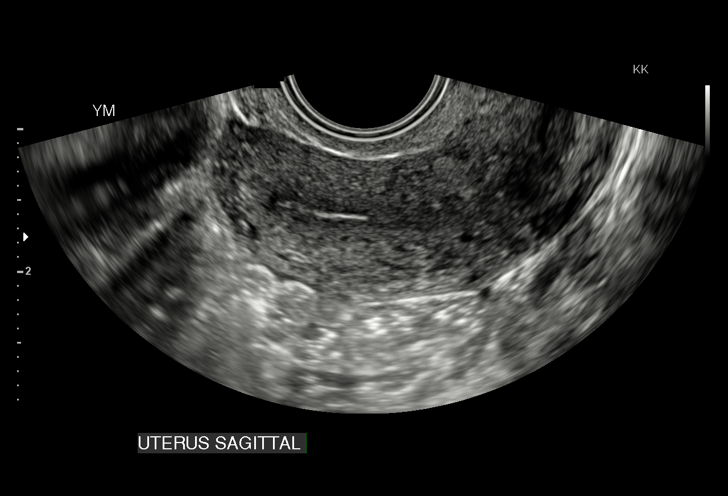
[im 35/69]
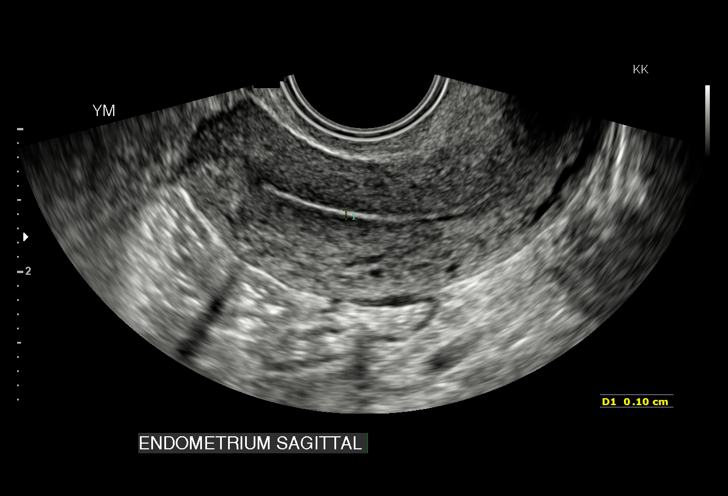
[im 40/69]
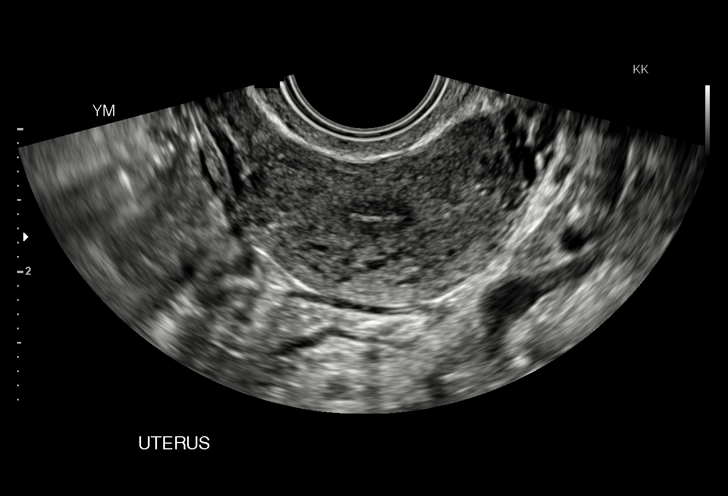
[im 43/69]
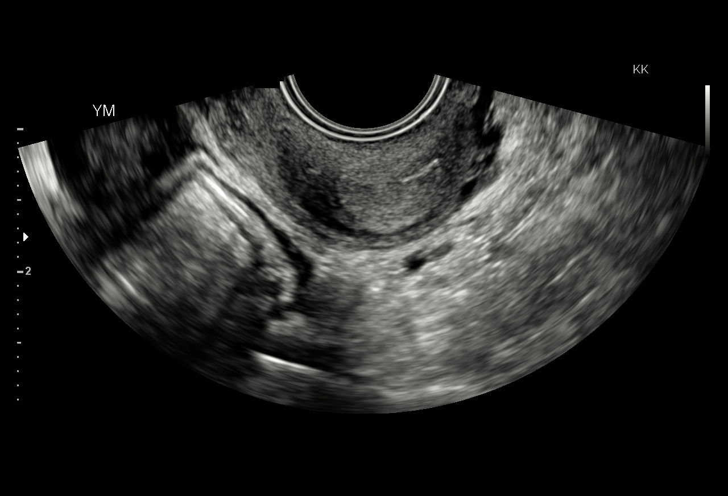
[im 49/69]
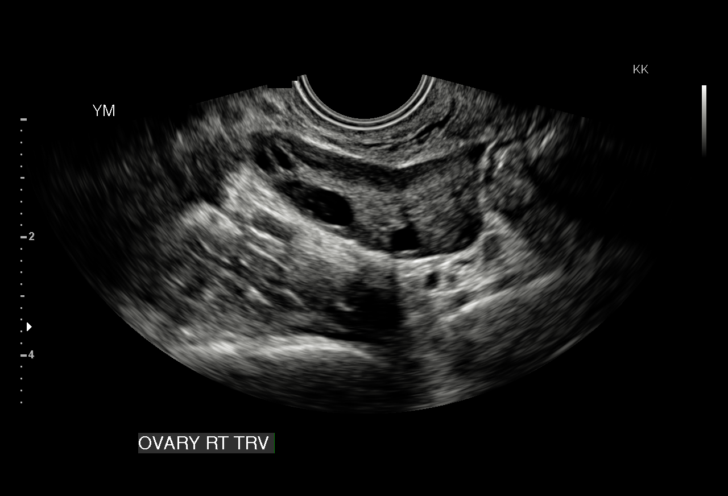
[im 54/69]
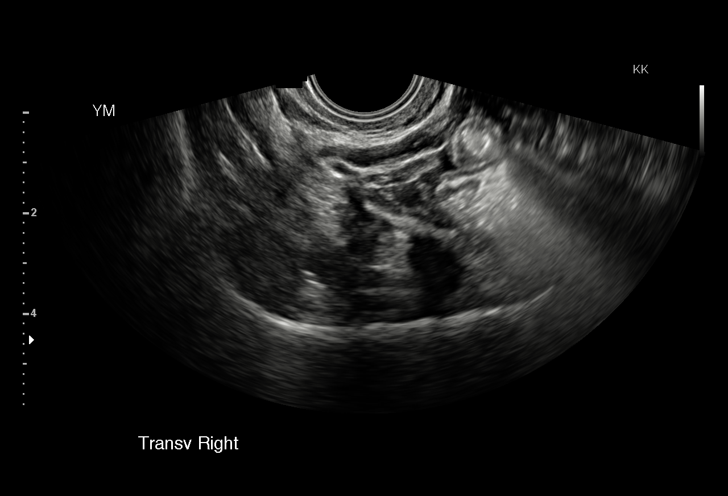
[im 57/69]
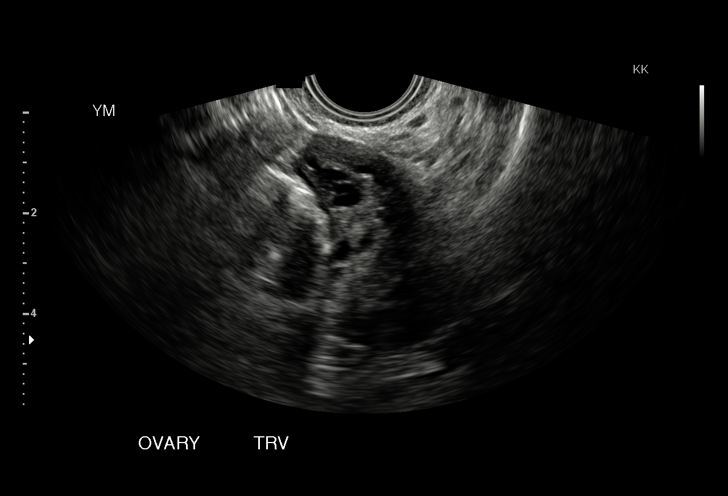
[im 63/69]
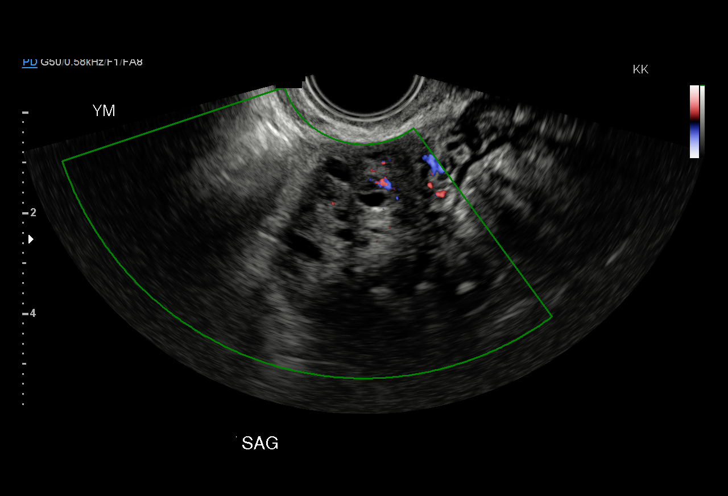
[im 69/69]
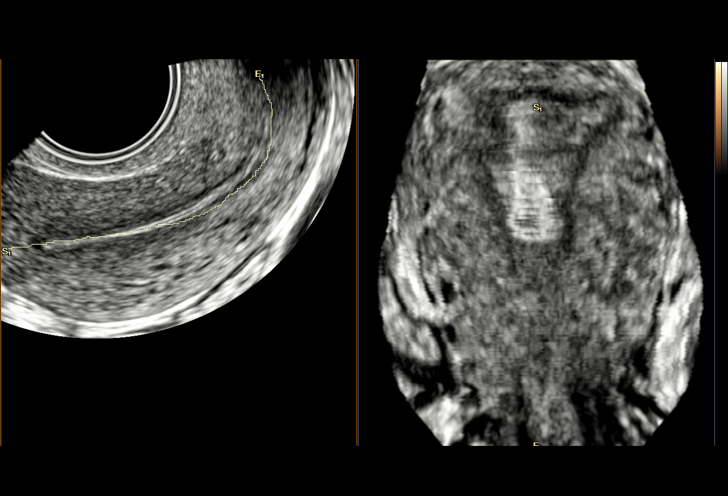

[15 of 25 positions shown; findings below may reference images not displayed]

FINDINGS: Uterus

Measurements: 7.0 x 2.1 x 4.0 cm. No fibroids or other mass
visualized.

Endometrium

Thickness: Normal thickness, 1 mm.  No focal abnormality visualized.

Right ovary

Measurements: 3.3 x 1.6 x 4.1 cm. Normal appearance/no adnexal mass.

Left ovary

Measurements: 3.5 x 3.2 x 1.9 cm. Normal appearance/no adnexal mass.

Other findings

No free fluid.
IMPRESSION: Normal study.

## 2020-07-03 ENCOUNTER — Ambulatory Visit (HOSPITAL_COMMUNITY): Payer: Self-pay | Admitting: Clinical

## 2022-03-16 ENCOUNTER — Encounter (HOSPITAL_COMMUNITY): Payer: Self-pay | Admitting: Emergency Medicine

## 2022-03-16 ENCOUNTER — Other Ambulatory Visit: Payer: Self-pay

## 2022-03-16 ENCOUNTER — Ambulatory Visit (HOSPITAL_COMMUNITY)
Admission: EM | Admit: 2022-03-16 | Discharge: 2022-03-16 | Disposition: A | Discharge status: Home / Self Care | Payer: Self-pay | Attending: Family Medicine | Admitting: Family Medicine

## 2022-03-16 DIAGNOSIS — S060X9A Concussion with loss of consciousness of unspecified duration, initial encounter: Secondary | ICD-10-CM
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
## 2022-03-16 NOTE — Discharge Instructions (Addendum)
Your nerve exam is normal  I think you have a concussion  Tylenol as needed for headache

## 2022-03-16 NOTE — ED Notes (Signed)
Notified dr banister of patient presentation and complaint 

## 2022-06-10 ENCOUNTER — Other Ambulatory Visit: Payer: Self-pay

## 2022-06-10 ENCOUNTER — Emergency Department (HOSPITAL_COMMUNITY)
Admission: EM | Admit: 2022-06-10 | Discharge: 2022-06-10 | Discharge status: Against Medical Advice | Payer: Managed Care, Other (non HMO) | Attending: Physician Assistant | Admitting: Physician Assistant

## 2022-06-10 ENCOUNTER — Emergency Department (HOSPITAL_COMMUNITY): Payer: Managed Care, Other (non HMO)

## 2022-06-10 ENCOUNTER — Encounter (HOSPITAL_COMMUNITY): Payer: Self-pay

## 2022-06-10 DIAGNOSIS — J45909 Unspecified asthma, uncomplicated: Secondary | ICD-10-CM | POA: Diagnosis not present

## 2022-06-10 DIAGNOSIS — R0602 Shortness of breath: Secondary | ICD-10-CM | POA: Insufficient documentation

## 2022-06-10 DIAGNOSIS — Z5321 Procedure and treatment not carried out due to patient leaving prior to being seen by health care provider: Secondary | ICD-10-CM | POA: Diagnosis not present

## 2022-06-10 DIAGNOSIS — R079 Chest pain, unspecified: Secondary | ICD-10-CM | POA: Diagnosis present

## 2022-06-10 LAB — CBC WITH DIFFERENTIAL/PLATELET
Abs Immature Granulocytes: 0.04 10*3/uL (ref 0.00–0.07)
Basophils Absolute: 0.1 10*3/uL (ref 0.0–0.1)
Basophils Relative: 1 %
Eosinophils Absolute: 0.2 10*3/uL (ref 0.0–0.5)
Eosinophils Relative: 2 %
HCT: 42.9 % (ref 36.0–46.0)
Hemoglobin: 14.1 g/dL (ref 12.0–15.0)
Immature Granulocytes: 0 %
Lymphocytes Relative: 22 %
Lymphs Abs: 2.1 10*3/uL (ref 0.7–4.0)
MCH: 30.1 pg (ref 26.0–34.0)
MCHC: 32.9 g/dL (ref 30.0–36.0)
MCV: 91.5 fL (ref 80.0–100.0)
Monocytes Absolute: 0.6 10*3/uL (ref 0.1–1.0)
Monocytes Relative: 6 %
Neutro Abs: 6.5 10*3/uL (ref 1.7–7.7)
Neutrophils Relative %: 69 %
Platelets: 236 10*3/uL (ref 150–400)
RBC: 4.69 MIL/uL (ref 3.87–5.11)
RDW: 12.6 % (ref 11.5–15.5)
WBC: 9.4 10*3/uL (ref 4.0–10.5)
nRBC: 0 % (ref 0.0–0.2)

## 2022-06-10 LAB — I-STAT BETA HCG BLOOD, ED (MC, WL, AP ONLY): I-stat hCG, quantitative: <5 m[IU]/mL (ref <5)

## 2022-06-10 LAB — TROPONIN I (HIGH SENSITIVITY): Troponin I (High Sensitivity): <2 ng/L (ref <18)

## 2022-06-10 LAB — BASIC METABOLIC PANEL
Anion gap: 8 (ref 5–15)
BUN: 11 mg/dL (ref 6–20)
CO2: 25 mmol/L (ref 22–32)
Calcium: 9.2 mg/dL (ref 8.9–10.3)
Chloride: 107 mmol/L (ref 98–111)
Creatinine, Ser: 0.63 mg/dL (ref 0.44–1.00)
GFR, Estimated: >60 mL/min (ref >60)
Glucose, Bld: 113 mg/dL — ABNORMAL HIGH (ref 70–99)
Potassium: 3.7 mmol/L (ref 3.5–5.1)
Sodium: 140 mmol/L (ref 135–145)

## 2022-06-10 NOTE — ED Triage Notes (Signed)
Patient c/o  intermittent mid chest pain and SOB x 2 days. Patient denies any dizziness, N/V. Patient states pain is worse when she first wakes up in the morning.

## 2022-06-10 NOTE — ED Provider Triage Note (Signed)
Emergency Medicine Provider Triage Evaluation Note  Adia Myszka , a 26 y.o. female  was evaluated in triage.  Pt complains of intermittent chest pain over the last 2 days.  Worse first thing in the morning, improves throughout the day.  No history of PE, DVT, recent surgery, immobilization or malignancy.  No pain or swelling to legs.  Pain nonexertional, nonpleuritic in nature.  History of asthma however does not feel similar.  No recent illnesses.  Review of Systems  Positive: CP Negative: LE swelling  Physical Exam  BP 113/73   Pulse 77   Temp 98.3 F (36.8 C) (Oral)   Resp 14   Ht 5\' 7"  (1.702 m)   Wt 63.5 kg   SpO2 100%   BMI 21.93 kg/m  Gen:   Awake, no distress   Resp:  Normal effort  MSK:   Moves extremities without difficulty  Other:    Medical Decision Making  Medically screening exam initiated at 8:49 AM.  Appropriate orders placed.  Taunia Oguin was informed that the remainder of the evaluation will be completed by another provider, this initial triage assessment does not replace that evaluation, and the importance of remaining in the ED until their evaluation is complete.  CP   Keavon Sensing A, PA-C 06/10/22 0850

## 2023-01-01 ENCOUNTER — Emergency Department (HOSPITAL_COMMUNITY)
Admission: EM | Admit: 2023-01-01 | Discharge: 2023-01-01 | Disposition: A | Discharge status: Home / Self Care | Payer: No Typology Code available for payment source | Attending: Emergency Medicine | Admitting: Emergency Medicine

## 2023-01-01 ENCOUNTER — Other Ambulatory Visit: Payer: Self-pay

## 2023-01-01 ENCOUNTER — Encounter (HOSPITAL_COMMUNITY): Payer: Self-pay

## 2023-01-01 DIAGNOSIS — G90A Postural orthostatic tachycardia syndrome (POTS): Secondary | ICD-10-CM | POA: Diagnosis not present

## 2023-01-01 DIAGNOSIS — R000 Tachycardia, unspecified: Secondary | ICD-10-CM

## 2023-01-01 LAB — URINALYSIS, W/ REFLEX TO CULTURE (INFECTION SUSPECTED)
Bilirubin Urine: NEGATIVE
Glucose, UA: NEGATIVE mg/dL
Hgb urine dipstick: NEGATIVE
Ketones, ur: NEGATIVE mg/dL
Leukocytes,Ua: NEGATIVE
Nitrite: NEGATIVE
Protein, ur: NEGATIVE mg/dL
Specific Gravity, Urine: 1.01 (ref 1.005–1.030)
pH: 7 (ref 5.0–8.0)

## 2023-01-01 LAB — CBC WITH DIFFERENTIAL/PLATELET
Abs Immature Granulocytes: 0.02 10*3/uL (ref 0.00–0.07)
Basophils Absolute: 0.1 10*3/uL (ref 0.0–0.1)
Basophils Relative: 1 %
Eosinophils Absolute: 0.1 10*3/uL (ref 0.0–0.5)
Eosinophils Relative: 2 %
HCT: 43.1 % (ref 36.0–46.0)
Hemoglobin: 14.6 g/dL (ref 12.0–15.0)
Immature Granulocytes: 0 %
Lymphocytes Relative: 34 %
Lymphs Abs: 2.7 10*3/uL (ref 0.7–4.0)
MCH: 30.4 pg (ref 26.0–34.0)
MCHC: 33.9 g/dL (ref 30.0–36.0)
MCV: 89.6 fL (ref 80.0–100.0)
Monocytes Absolute: 0.6 10*3/uL (ref 0.1–1.0)
Monocytes Relative: 8 %
Neutro Abs: 4.4 10*3/uL (ref 1.7–7.7)
Neutrophils Relative %: 55 %
Platelets: 224 10*3/uL (ref 150–400)
RBC: 4.81 MIL/uL (ref 3.87–5.11)
RDW: 11.7 % (ref 11.5–15.5)
WBC: 7.9 10*3/uL (ref 4.0–10.5)
nRBC: 0 % (ref 0.0–0.2)

## 2023-01-01 LAB — BASIC METABOLIC PANEL
Anion gap: 10 (ref 5–15)
BUN: 9 mg/dL (ref 6–20)
CO2: 24 mmol/L (ref 22–32)
Calcium: 9.5 mg/dL (ref 8.9–10.3)
Chloride: 104 mmol/L (ref 98–111)
Creatinine, Ser: 0.63 mg/dL (ref 0.44–1.00)
GFR, Estimated: >60 mL/min (ref >60)
Glucose, Bld: 110 mg/dL — ABNORMAL HIGH (ref 70–99)
Potassium: 3.7 mmol/L (ref 3.5–5.1)
Sodium: 138 mmol/L (ref 135–145)

## 2023-01-01 LAB — RAPID URINE DRUG SCREEN, HOSP PERFORMED
Amphetamines: NOT DETECTED
Barbiturates: NOT DETECTED
Benzodiazepines: NOT DETECTED
Cocaine: NOT DETECTED
Opiates: POSITIVE — AB
Tetrahydrocannabinol: POSITIVE — AB

## 2023-01-01 LAB — PREGNANCY, URINE: Preg Test, Ur: NEGATIVE

## 2023-01-01 MED ORDER — SODIUM CHLORIDE 0.9 % IV BOLUS
500.0000 mL | Freq: Once | INTRAVENOUS | Status: AC
  Administered 2023-01-01: 500 mL via INTRAVENOUS

## 2023-01-01 NOTE — ED Triage Notes (Addendum)
Patient said for the past 2 weeks when she goes from sitting to standing her heart rate jumps up to 160. Has been feeling dizzy. Patient said she does vape and used it last at 7am.

## 2023-01-01 NOTE — ED Provider Notes (Signed)
[facility] AT [facility] Provider Note   CSN: [id number] Arrival date & time: 01/01/23  1213     History  Chief Complaint  Patient presents with   Tachycardia    Christina Contreras is a 27 y.o. female.  HPI     27 year old female comes in with chief complaint of elevated heart rate and dizziness.  She has no known medical problems.  Patient states that over the last few weeks, she has had bouts of elevated heart rate.  This week however her symptoms have been more persistent and she has associated dizziness.  Typically her symptoms are always triggered with her standing up.  Patient will get dizzy for about 30 seconds or so.  On occasions she feels like he is about to faint.  Patient wears an Scientist, physiological, and has noted that her heart rate will fluctuate between 90 and 150 throughout the day.  She is unsure if the elevated heart rate with exertion or with her laying down.  There is family history of premature CAD, including her own father who had a fatal heart attack in his 35s.   Patient admits to vaping regularly.  She also uses marijuana regularly.  Pt has no hx of PE, DVT and denies any exogenous hormone (testosterone / estrogen) use, long distance travels or surgery in the past 6 weeks, active cancer, recent immobilization.   Home Medications Prior to Admission medications   Medication Sig Start Date End Date Taking? Authorizing Provider  albuterol (PROVENTIL HFA;VENTOLIN HFA) 108 (90 BASE) MCG/ACT inhaler Inhale 1-2 puffs into the lungs every 6 (six) hours as needed for wheezing or shortness of breath.    [provider]  BIOTIN PO Take 1 tablet by mouth daily.    [provider]  IRON PO Take 1 tablet by mouth daily with breakfast.    [provider]  Levonorgestrel (SKYLA) 13.5 MG IUD 1 each by Intrauterine route continuous. Patient not taking: Reported on 03/16/2022    [provider]  Multiple  Vitamins-Minerals (MULTIVITAMIN WITH MINERALS) tablet Take 1 tablet by mouth daily.    [provider]  omeprazole (PRILOSEC) 20 MG capsule Take 1 capsule (20 mg total) by mouth daily. 04/25/16   Lavera Guise, MD  sucralfate (CARAFATE) 1 g tablet Take 1 tablet (1 g total) by mouth 4 (four) times daily -  with meals and at bedtime. Patient not taking: Reported on 03/16/2022 04/25/16   Lavera Guise, MD      Allergies    Nsaids    Review of Systems   Review of Systems  All other systems reviewed and are negative.   Physical Exam Updated Vital Signs BP 104/73   Pulse 78   Temp 98.5 F (36.9 C) (Oral)   Resp 18   Ht 5\' 7"  (1.702 m)   Wt 65.3 kg   SpO2 99%   BMI 22.55 kg/m  Physical Exam Vitals and nursing note reviewed.  Constitutional:      Appearance: She is well-developed.  HENT:     Head: Atraumatic.     Nose: Nose normal.  Eyes:     Extraocular Movements: Extraocular movements intact.     Pupils: Pupils are equal, round, and reactive to light.  Cardiovascular:     Rate and Rhythm: Tachycardia present.  Pulmonary:     Effort: Pulmonary effort is normal.  Musculoskeletal:        General: No swelling or tenderness.  Cervical back: Normal range of motion and neck supple.     Right lower leg: No edema.     Left lower leg: No edema.  Skin:    General: Skin is warm and dry.  Neurological:     Mental Status: She is alert and oriented to person, place, and time.     ED Results / Procedures / Treatments   Labs (all labs ordered are listed, but only abnormal results are displayed) Labs Reviewed  BASIC METABOLIC PANEL - Abnormal; Notable for the following components:      Result Value   Glucose, Bld 110 (*)    All other components within normal limits  RAPID URINE DRUG SCREEN, HOSP PERFORMED - Abnormal; Notable for the following components:   Opiates POSITIVE (*)    Tetrahydrocannabinol POSITIVE (*)    All other components within normal limits  URINALYSIS,  W/ REFLEX TO CULTURE (INFECTION SUSPECTED) - Abnormal; Notable for the following components:   Bacteria, UA RARE (*)    All other components within normal limits  CBC WITH DIFFERENTIAL/PLATELET  PREGNANCY, URINE    EKG None  Radiology No results found.  Procedures Procedures    Medications Ordered in ED Medications  sodium chloride 0.9 % bolus 500 mL (0 mLs Intravenous Stopped 01/01/23 1445)    ED Course/ Medical Decision Making/ A&P                             Medical Decision Making Amount and/or Complexity of Data Reviewed Labs: ordered.   27 year old female comes in with chief complaint of dizziness.  Her dizziness is always provoked with her standing up.  She also indicates that her heart rate has been up and down.  Symptoms have been more pronounced over the last week and she has had near fainting spells.  Patient has no past medical history that is concerning.  She has reassuring social history besides marijuana use.  Differential diagnosis considered for this patient includes possible POTS, SVT, atrial arrhythmia,  Patient placed on telemetry.  We have ordered basic labs.   Reassessment: I have independently interpreted patient's telemetry monitoring.  She has not had any episodes of arrhythmia.  Patient has had tachycardia.  She had orthostasis done.  During orthostasis, she got dizzy again.  Ambulatory pulse ox however was fine.  There is no vertigo.  We do not think in her case there is any posterior circulation compromise. It is unclear to me who will be able to formally diagnose her with POTS.  Therefore I given patient follow-up with both neurology and cardiology.  Final Clinical Impression(s) / ED Diagnoses Final diagnoses:  POTS (postural orthostatic tachycardia syndrome)  Tachycardia    Rx / DC Orders ED Discharge Orders          Ordered    Ambulatory referral to Cardiology       Comments: If you have not heard from the Cardiology office  within the next 72 hours please call [phone].   01/01/23 1446              Derwood Kaplan, MD 01/01/23 1533

## 2023-01-01 NOTE — Discharge Instructions (Addendum)
We saw in the emergency room for elevated heart rate.  It appears that you likely have POTS.  This diagnosis cannot be made in the emergency room.  Please follow-up with the specialist by calling the number provided to set up an appointment.

## 2023-02-09 ENCOUNTER — Encounter: Payer: Self-pay | Admitting: General Practice

## 2023-02-09 ENCOUNTER — Ambulatory Visit (INDEPENDENT_AMBULATORY_CARE_PROVIDER_SITE_OTHER): Payer: No Typology Code available for payment source | Admitting: Internal Medicine

## 2023-02-09 ENCOUNTER — Other Ambulatory Visit: Payer: Self-pay | Admitting: Internal Medicine

## 2023-02-09 ENCOUNTER — Encounter: Payer: Self-pay | Admitting: Internal Medicine

## 2023-02-09 ENCOUNTER — Ambulatory Visit: Payer: Managed Care, Other (non HMO) | Attending: Internal Medicine

## 2023-02-09 VITALS — BP 96/60 | HR 102 | Temp 98.9°F | Ht 67.0 in | Wt 147.0 lb

## 2023-02-09 DIAGNOSIS — I951 Orthostatic hypotension: Secondary | ICD-10-CM | POA: Insufficient documentation

## 2023-02-09 DIAGNOSIS — N809 Endometriosis, unspecified: Secondary | ICD-10-CM

## 2023-02-09 DIAGNOSIS — Z8711 Personal history of peptic ulcer disease: Secondary | ICD-10-CM | POA: Insufficient documentation

## 2023-02-09 DIAGNOSIS — R000 Tachycardia, unspecified: Secondary | ICD-10-CM

## 2023-02-09 DIAGNOSIS — J4520 Mild intermittent asthma, uncomplicated: Secondary | ICD-10-CM

## 2023-02-09 DIAGNOSIS — J45909 Unspecified asthma, uncomplicated: Secondary | ICD-10-CM | POA: Insufficient documentation

## 2023-02-09 NOTE — Assessment & Plan Note (Signed)
Told at ER that she may have pots. We discussed today that this would be a diagnosis of exclusion. We are checking labs including CBC, CMP, TSH, free T4, cortisol, vitamin D and B12. We have ordered a holter monitor to rule out underlying cardiac cause. It is possible this is side effect from THC usage as this is a rare side effect (orthostatic hypotension and tachycardia).  

## 2023-02-09 NOTE — Patient Instructions (Signed)
We will check the labs today and get the heart monitor ordered.

## 2023-02-09 NOTE — Assessment & Plan Note (Signed)
Told at ER that she may have pots. We discussed today that this would be a diagnosis of exclusion. We are checking labs including CBC, CMP, TSH, free T4, cortisol, vitamin D and B12. We have ordered a holter monitor to rule out underlying cardiac cause. It is possible this is side effect from [facility] usage as this is a rare side effect (orthostatic hypotension and tachycardia).

## 2023-02-09 NOTE — Progress Notes (Signed)
   Subjective:   Patient ID: Christina Contreras, female    DOB: Dec 20, 1995, 27 y.o.   MRN: [id number]  HPI The patient is a new 27 YO female coming in for concerns of new syncope and dizziness with standing and fast HR up to 150 associated. She wears apple watch and has gotten alerts for HR 150s. She is feeling dizzy and lightheaded most days for last 1.5 months. Seen at ER and referred to cardiology but apt not until October. She is having some chest pressure when lightheaded. Previously just with standing but in the last week or so with sitting sometimes. She has passing out episodes at least once a week. On chronic opioids for back pain and prescribed hydrocodone up to TID prn. She is also using THC regularly. Uses depo-provera for birth control and has endometriosis that this helps control. [facility]yn [facility]   PMH, [facility], social history reviewed and updated  Review of Systems  Constitutional:  Positive for activity change. Negative for appetite change.  HENT: Negative.    Eyes: Negative.   Respiratory:  Positive for chest tightness and shortness of breath. Negative for cough.   Cardiovascular:  Positive for chest pain. Negative for palpitations and leg swelling.  Gastrointestinal:  Negative for abdominal distention, abdominal pain, constipation, diarrhea, nausea and vomiting.  Musculoskeletal:  Positive for back pain and neck pain.  Skin: Negative.   Neurological:  Positive for dizziness, syncope and light-headedness.  Psychiatric/Behavioral: Negative.      Objective:  Physical Exam Constitutional:      Appearance: She is well-developed.  HENT:     Head: Normocephalic and atraumatic.  Cardiovascular:     Rate and Rhythm: Normal rate and regular rhythm.  Pulmonary:     Effort: Pulmonary effort is normal. No respiratory distress.     Breath sounds: Normal breath sounds. No wheezing or rales.  Abdominal:     General: Bowel sounds are normal. There is  no distension.     Palpations: Abdomen is soft.     Tenderness: There is no abdominal tenderness. There is no rebound.  Musculoskeletal:     Cervical back: Normal range of motion.  Skin:    General: Skin is warm and dry.  Neurological:     Mental Status: She is alert and oriented to person, place, and time.     Coordination: Coordination normal.     Vitals:   02/09/23 0841  BP: 96/60  Pulse: (!) 102  Temp: 98.9 F (37.2 C)  TempSrc: Oral  SpO2: 96%  Weight: 147 lb (66.7 kg)  Height: 5\' 7"  (1.702 m)    Assessment & Plan:

## 2023-02-09 NOTE — Assessment & Plan Note (Signed)
Uses albuterol prn and has concurrent allergies. She is using benadryl prn only for allergies. She gets random and exercise induced SOB. Tries to limit albuterol due to tachycardia it produces.

## 2023-02-09 NOTE — Progress Notes (Unsigned)
Enrolled for [employer] to mail a ZIO XT long term holter monitor to the patients address on file.   DOD to read. 

## 2023-02-09 NOTE — Assessment & Plan Note (Signed)
Likely nsaid related and she avoids currently. Taking omeprazole chronically and can continue.

## 2023-02-09 NOTE — Assessment & Plan Note (Signed)
Using depo-provera and getting every 3 months regularly. She does have painful episodes and bleeding without regular control of periods.

## 2023-02-14 DIAGNOSIS — R000 Tachycardia, unspecified: Secondary | ICD-10-CM | POA: Diagnosis not present

## 2023-02-14 DIAGNOSIS — I951 Orthostatic hypotension: Secondary | ICD-10-CM

## 2023-03-25 ENCOUNTER — Ambulatory Visit: Payer: Managed Care, Other (non HMO) | Admitting: Internal Medicine

## 2023-06-09 ENCOUNTER — Ambulatory Visit: Payer: No Typology Code available for payment source | Attending: Internal Medicine | Admitting: Internal Medicine

## 2023-06-09 ENCOUNTER — Encounter: Payer: Self-pay | Admitting: Internal Medicine

## 2023-06-09 VITALS — BP 117/72 | HR 94 | Ht 67.0 in | Wt 148.2 lb

## 2023-06-09 DIAGNOSIS — I951 Orthostatic hypotension: Secondary | ICD-10-CM

## 2023-06-09 NOTE — Patient Instructions (Signed)
Medication Instructions:  Your physician recommends that you continue on your current medications as directed. Please refer to the Current Medication list given to you today.  *If you need a refill on your cardiac medications before your next appointment, please call your pharmacy*   Lab Work: None ordered.  If you have labs (blood work) drawn today and your tests are completely normal, you will receive your results only by: MyChart Message (if you have MyChart) OR A paper copy in the mail If you have any lab test that is abnormal or we need to change your treatment, we will call you to review the results.   Testing/Procedures: None ordered.    Follow-Up: At [facility], you and your health needs are our priority.  As part of our continuing mission to provide you with exceptional heart care, we have created designated Provider Care Teams.  These Care Teams include your primary Cardiologist (physician) and Advanced Practice Providers (APPs -  Physician Assistants and Nurse Practitioners) who all work together to provide you with the care you need, when you need it.  We recommend signing up for the patient portal called "MyChart".  Sign up information is provided on this After Visit Summary.  MyChart is used to connect with patients for Virtual Visits (Telemedicine).  Patients are able to view lab/test results, encounter notes, upcoming appointments, etc.  Non-urgent messages can be sent to your provider as well.   To learn more about what you can do with MyChart, go to [email or web address].    Your next appointment:   Telephone Visit with Dr Graciela Husbands in 8-12 weeks - I will have his scheduler contact you to set up this appointment.  Other Instructions Discussed salt substitutes with a  goal of 7-10 gms of Sodium or 12-15 gm of sodium Chloride daily.  Rehydration solutions include Liquid IV, NUUN, TriOral, Normralyte pedialyte advanced care and Banana Bags.  Salt tablets include  plain salt tablets, SaltStick Vitassium, thermatabs amongst others.   The higher sodium concentration per serving on this list Within You hydration formula also at 500 mg normalyte about 800 mg of sodium per serving [id number] mg     Salt supplements are best used with adjunctive sugar  Also discussed the role of compression wear including thigh sleeves and abdominal binders.  Calf compression is not specifically recommended..   [facility] as discussed by Dr Graciela Husbands - [phone]

## 2023-06-09 NOTE — Progress Notes (Unsigned)
ELECTROPHYSIOLOGY CONSULT NOTE  Patient ID: Christina Contreras, MRN: [id number], DOB/AGE: 1996-06-25 27 y.o. Admit date: (Not on file) Date of Consult: 06/09/2023  Primary Physician: Myrlene Broker, MD Primary Cardiologist: new     Christina Contreras is a 27 y.o. female who is being seen today for the evaluation of diozziness  at the request of Dr Okey Dupre .    HPI Christina Contreras is a 27 y.o. female with a lifelong history of orthostatic lightheadedness and exercise intolerance complicated by infrequent syncope symptoms of which have worsened in the last 4 years more so in the last year and a half and worse again in the last 6 months.  She has a history of chronic back issues causing significant pain for which she has been followed by orthopedics now for about 7 or 8 years and is on chronic narcotics.  Lightheadedness has been accompanied by heat intolerance, shower intolerance, exercise intolerance, and menses intolerance prior to going on hormonal suppression therapy about 10 years ago for endometriosis.  Her diet is probably replete of fluid, she has yellow urine which she takes multivitamins and is drinking about 4 quarts a day, and modestly replete of sodium  Recurrent syncope with a stereotypical prodrome of visual loss, "static in the ears "diaphoresis with recovery symptoms of flushing nausea residual fatigue.  Described as pale.  Sleep is poor.  GI symptoms are modest with a "history of ulcers "  History of anxiety and depression denies active suicidality  Father died 4 years ago.  Myocardial infarction by autopsy.  She lost 80 pounds after this.  She is closely connected to his parents.  Has not grieved much.  DATE TEST EF                   Date Cr K Hgb  5/24 0.63 3.7 14.6            Past Medical History:  Diagnosis Date   Anxiety    no med   Asthma    Depression    no med   Endometriosis determined by laparoscopy 08/16/2014   Family history of  early CAD    Lower back pain    ? DDD   Other and unspecified ovarian cysts       Surgical History:  Past Surgical History:  Procedure Laterality Date   LAPAROSCOPY N/A 08/16/2014   Procedure: LAPAROSCOPY DIAGNOSTIC;  Surgeon: Allie Bossier, MD;  Location: [facility];  Service: Gynecology;  Laterality: N/A;   tubes in ears       Home Meds: No outpatient medications have been marked as taking for the 06/09/23 encounter (Office Visit) with Duke Salvia, MD.   Current Facility-Administered Medications for the 06/09/23 encounter (Office Visit) with Duke Salvia, MD  Medication   medroxyPROGESTERone (DEPO-PROVERA) injection 150 mg    Allergies:  Allergies  Allergen Reactions   Nsaids     Has stomach ulcers    Social History   Socioeconomic History   Marital status: Single    Spouse name: Not on file   Number of children: Not on file   Years of education: Not on file   Highest education level: Not on file  Occupational History   Not on file  Tobacco Use   Smoking status: Former    Current packs/day: 0.25    Average packs/day: 0.3 packs/day for 3.0 years (0.8 ttl pk-yrs)    Types: Cigarettes   Smokeless tobacco: Never  Vaping Use   Vaping status: Every Day   Substances: Nicotine, Flavoring  Substance and Sexual Activity   Alcohol use: Yes    Alcohol/week: 0.0 standard drinks of alcohol   Drug use: No   Sexual activity: Yes    Partners: Male    Birth control/protection: Injection  Other Topics Concern   Not on file  Social History Narrative   Not on file   Social Determinants of Health   Financial Resource Strain: Not on file  Food Insecurity: Not on file  Transportation Needs: Not on file  Physical Activity: Not on file  Stress: Not on file  Social Connections: Not on file  Intimate Partner Violence: Not on file     Family History  Problem Relation Age of Onset   Endometriosis Mother    CAD Father    Endometriosis Other      ROS:  Please see the  history of present illness.     All other systems reviewed and negative.    Physical Exam: Blood pressure 117/72, pulse 94, height 5\' 7"  (1.702 m), weight 148 lb 3.2 oz (67.2 kg), SpO2 99%. General: Well developed, well nourished female in no acute distress. Head: Normocephalic, atraumatic, sclera non-icteric, no xanthomas, nares are without discharge. EENT: normal  Lymph Nodes:  none Neck: Negative for carotid bruits. JVD not elevated. Back:without scoliosis kyphosis Lungs: Clear bilaterally to auscultation without wheezes, rales, or rhonchi. Breathing is unlabored. Heart: RRR with S1 S2. Nomurmur . No rubs, or gallops appreciated. Abdomen: Soft, non-tender, non-distended with normoactive bowel sounds. No hepatomegaly. No rebound/guarding. No obvious abdominal masses. Msk:  Strength and tone appear normal for age. Extremities: No clubbing or cyanosis. No  edema.  Distal pedal pulses are 2+ and equal bilaterally. Skin: Warm and Dry Neuro: Alert and oriented X 3. CN III-XII intact Grossly normal sensory and motor function . Psych:  Responds to questions appropriately with a normal affect.        EKG: Sinus at 87 Interval 17/08/37   Assessment and Plan:  Orthostatic/heat intolerance without objective changes on vital signs  Syncope vasovagal  Anxiety depression  Back pain-chronic on narcotics   Patient has symptoms consistent with autonomic insufficiency albeit without objective measurements today on vital signs.  Symptoms on her event recorder were associated with sinus rhythm though often with tachycardia.  We discussed extensively the issues of dysautonomia, the physiology of orthstasis and positional stress.  We discussed the role of salt and water repletion, the importance of exercise, often needing to be started in the recumbent position, and the awareness of triggers and the role of ambient heat and dehydration--as well as importance of recognizing the prodrome prior to  her syncope  Discussed salt substitutes with a  goal of 7-10 gms of Sodium or 12-15 gm of sodium Chloride daily.  Rehydration solutions include Liquid IV, NUUN, TriOral, Normralyte pedialyte advanced care and Banana Bags.  Salt tablets include plain salt tablets, SaltStick Vitassium, thermatabs amongst others.   The higher sodium concentration per serving on this list Within You hydration formula also at 500 mg normalyte about 800 mg of sodium per serving LMNT1000 mg     Salt supplements are best used with adjunctive sugar; as she likes dietary sodium I suggested that she put 2-3 teaspoons of salt into a little snack bag and make it her goal to get rid of that salt load each day.  Also discussed the role of compression wear including thigh sleeves and abdominal binders.  Calf compression is not specifically recommended..  Encouraged her to read framing as she has from alcohol marijuana and cigarettes and to talk with her orthopedist as to whether there is an alternative to chronic narcotics for her back pain  Discussed the importance of her own mental health care and recommended [facility]      Sherryl Manges

## 2023-08-28 ENCOUNTER — Encounter: Payer: Self-pay | Admitting: Internal Medicine

## 2023-08-28 ENCOUNTER — Ambulatory Visit: Payer: No Typology Code available for payment source | Attending: Student | Admitting: Internal Medicine

## 2023-08-28 ENCOUNTER — Telehealth: Payer: Self-pay

## 2023-08-28 ENCOUNTER — Ambulatory Visit: Payer: No Typology Code available for payment source | Admitting: Internal Medicine

## 2023-08-28 DIAGNOSIS — R55 Syncope and collapse: Secondary | ICD-10-CM

## 2023-08-28 NOTE — Progress Notes (Signed)
 Electrophysiology TeleHealth Note      Date:  08/28/2023   ID:  Christina Contreras, DOB January 27, 1996, MRN [id number]  Location: patient's home  Provider location: [address], [place]  Evaluation Performed: Follow-up visit  PCP:  Rollene Almarie LABOR, MD  Cardiologist:    Electrophysiologist:  SK   Chief Complaint:  syncope  History of Present Illness:   My location is [facility]. The Patients location is their home . Christina Contreras is a 28 y.o. female who presents via audio conferencing for a telehealth visit today.  Since last being seen in our clinic for lifelong history of orthostatic lightheadedness and exercise intolerance, chronic back issues with pain the patient reports being ok Tachypalpiations-- but still better  Syncope x 2 -- at work walking  LH>> ( 30 sec)sat down>> LOC  630 am  -2  at grandparents, finished walking down the stairs>> sat down LOC  afternoon   Fluid intake  about 2 qys Salt   Compression different tools including thighs, full torso and girdle suit   Exercise not--  Asthma   BAck pain   The patient denies symptoms of fevers, chills, cough, or new SOB worrisome for COVID 19.    Past Medical History:  Diagnosis Date   Anxiety    no med   Asthma    Depression    no med   Endometriosis determined by laparoscopy 08/16/2014   Family history of early CAD    Lower back pain    ? DDD   Other and unspecified ovarian cysts     Past Surgical History:  Procedure Laterality Date   LAPAROSCOPY N/A 08/16/2014   Procedure: LAPAROSCOPY DIAGNOSTIC;  Surgeon: Harland JAYSON Birkenhead, MD;  Location: [facility];  Service: Gynecology;  Laterality: N/A;   tubes in ears      Current Outpatient Medications  Medication Sig Dispense Refill   albuterol  (PROVENTIL  HFA;VENTOLIN  HFA) 108 (90 BASE) MCG/ACT inhaler Inhale 1-2 puffs into the lungs every 6 (six) hours as needed for wheezing or shortness of breath.     BIOTIN PO Take 1 tablet by  mouth daily.     HYDROcodone -acetaminophen  (NORCO) 10-325 MG tablet Take 1 tablet by mouth 3 (three) times daily as needed.     IRON PO Take 1 tablet by mouth daily with breakfast.     Multiple Vitamins-Minerals (MULTIVITAMIN WITH MINERALS) tablet Take 1 tablet by mouth daily.     omeprazole  (PRILOSEC) 20 MG capsule Take 1 capsule (20 mg total) by mouth daily. 30 capsule 0   Current Facility-Administered Medications  Medication Dose Route Frequency Provider Last Rate Last Admin   medroxyPROGESTERone  (DEPO-PROVERA ) injection 150 mg  150 mg Intramuscular Q90 days Anyanwu, Ugonna A, MD   150 mg at 05/23/15 1041    Allergies:   Nsaids   ROS:  Please see the history of present illness.   All other systems are personally reviewed and negative.    Exam:    Vital Signs:  There were no vitals taken for this visit.      Labs/Other Tests and Data Reviewed:    Recent Labs: 01/01/2023: BUN 9; Creatinine, Ser 0.63; Hemoglobin 14.6; Platelets 224; Potassium 3.7; Sodium 138   Wt Readings from Last 3 Encounters:  06/09/23 148 lb 3.2 oz (67.2 kg)  02/09/23 147 lb (66.7 kg)  01/01/23 144 lb (65.3 kg)     Other studies personally reviewed: Additional studies/ records that were reviewed today include:  ASSESSMENT & PLAN:    Orthostatic/heat intolerance without objective changes on vital signs   Syncope vasovagal   Anxiety depression   Back pain-chronic on narcotics     Fluid intake goal 3 quarts Salt goal 2 1/2 tsp  Raise HOB 4  Continue compression With recognition of prodrome>> lie down    Follow-up:   12weeks    Current medicines are reviewed at length with the patient today.   The patient  concerns regarding her medicines.  The following changes were made today:    Labs/ tests ordered today include: none No orders of the defined types were placed in this encounter.     Today, I have spent 21 minutes with the patient with telehealth technology discussing the  above.  Signed, Elspeth Sage, MD  08/28/2023 4:04 PM     [facility] [address] [place] [address] [phone] (office) [phone] (fax)

## 2023-08-28 NOTE — Telephone Encounter (Signed)
 ..  Pt understands that although there may be some limitations with this type of visit, we will take all precautions to reduce any security or privacy concerns.  Pt understands that this will be treated like an in office visit and we will file with pt's insurance, and there may be a patient responsible charge related to this service. ? ?

## 2023-09-02 NOTE — Patient Instructions (Signed)
 Medication Instructions:  Your physician recommends that you continue on your current medications as directed. Please refer to the Current Medication list given to you today.  *If you need a refill on your cardiac medications before your next appointment, please call your pharmacy*   Lab Work: None ordered.  If you have labs (blood work) drawn today and your tests are completely normal, you will receive your results only by: MyChart Message (if you have MyChart) OR A paper copy in the mail If you have any lab test that is abnormal or we need to change your treatment, we will call you to review the results.   Testing/Procedures: None ordered.    Follow-Up: At [facility], you and your health needs are our priority.  As part of our continuing mission to provide you with exceptional heart care, we have created designated Provider Care Teams.  These Care Teams include your primary Cardiologist (physician) and Advanced Practice Providers (APPs -  Physician Assistants and Nurse Practitioners) who all work together to provide you with the care you need, when you need it.  We recommend signing up for the patient portal called MyChart.  Sign up information is provided on this After Visit Summary.  MyChart is used to connect with patients for Virtual Visits (Telemedicine).  Patients are able to view lab/test results, encounter notes, upcoming appointments, etc.  Non-urgent messages can be sent to your provider as well.   To learn more about what you can do with MyChart, go to [email or web address].    Your next appointment:   12 weeks - Dr Celine scheduler will contact you to schedule this appointment.  Other Instructions Fluid intake goal 3 quarts Salt goal 2 1/2 tsp  Raise HOB 4  Continue compression With recognition of prodrome>> lie down

## 2023-11-16 ENCOUNTER — Ambulatory Visit: Payer: No Typology Code available for payment source | Admitting: Internal Medicine

## 2023-12-09 ENCOUNTER — Ambulatory Visit: Admitting: Internal Medicine

## 2023-12-17 NOTE — Progress Notes (Deleted)
  Electrophysiology Office Note:   Date:  12/17/2023  ID:  Christina Contreras, DOB Jul 22, 1996, MRN [id number]  Primary Cardiologist: None Primary Heart Failure: None Electrophysiologist: None  {Click to update primary MD,subspecialty MD or APP then REFRESH:1}    History of Present Illness:   Christina Contreras is a 28 y.o. female with h/o tachycardia, orthostatic lightheadedness (without objective VS changes), exercise intolerance, chronic pain on narcotics, endometriosis seen today for routine electrophysiology followup.   Seen in tele visit 08/2023 for episodes of syncope > 1 after walking, then sitting down at work, another after walking down stairs and sitting down.   Since last being seen in our clinic the patient reports doing ***.    She denies chest pain, palpitations***, dyspnea, PND, orthopnea, nausea, vomiting, dizziness, syncope, edema, weight gain, or early satiety.   Review of systems complete and found to be negative unless listed in HPI.   EP Information / Studies Reviewed:    EKG is not ordered today. EKG from 06/09/23 reviewed which showed NSR 87 bpm      Studies:  Cardiac Monitor 02/2023 > 13 days, NSR predominant, ave 87 bpm, no sustained arrhythmias, pt triggered events correlate with NSR   Risk Assessment/Calculations:     No BP recorded.  {Refresh Note OR Click here to enter BP  :1}***        Physical Exam:   VS:  There were no vitals taken for this visit.   Wt Readings from Last 3 Encounters:  06/09/23 148 lb 3.2 oz (67.2 kg)  02/09/23 147 lb (66.7 kg)  01/01/23 144 lb (65.3 kg)     GEN: Well nourished, well developed in no acute distress NECK: No JVD; No carotid bruits CARDIAC: {EPRHYTHM:28826}, no murmurs, rubs, gallops RESPIRATORY:  Clear to auscultation without rales, wheezing or rhonchi  ABDOMEN: Soft, non-tender, non-distended EXTREMITIES:  No edema; No deformity   ASSESSMENT AND PLAN:    Orthostatic Lightheadedness / Heat Intolerance without  Objective Vital Sign Changes  Vasovagal Syncope  -Pt recommended to get at least 8 g of salt intake per day, consuming 3 L or more of fluid a day.  Consistently exercise 30 to 40 minutes at a heart rate of 70% MPHR at least 5 times a week > use a recumbent machines such as a recumbent bicycle and work on lower body resistance training such as recumbent leg press.  -stay upright as much as possible / raise HOB 4" -compression stockings  -shift positions, cross/uncross legs as "makeshift" pumping action  -ensure 8 hours sleep each night  -recognize prodrome > lie down   Anxiety, Depression  Chronic Pain  -per Ortho / PCP   Follow up with Dr. Rodolfo Clan {EPFOLLOW [id number]}  Signed, Creighton Doffing, NP-C, AGACNP-BC [facility] - Electrophysiology  12/17/2023, 8:27 PM

## 2023-12-18 ENCOUNTER — Ambulatory Visit: Attending: Pulmonary Disease | Admitting: Pulmonary Disease

## 2023-12-18 DIAGNOSIS — I951 Orthostatic hypotension: Secondary | ICD-10-CM

## 2023-12-18 DIAGNOSIS — R000 Tachycardia, unspecified: Secondary | ICD-10-CM

## 2023-12-21 ENCOUNTER — Encounter: Payer: Self-pay | Admitting: Pulmonary Disease
